# Patient Record
Sex: Female | Born: 1960 | Race: White | Hispanic: No | Marital: Married | Smoking: Former smoker
Health system: Southern US, Community
[De-identification: ages and names within clinical notes are randomized; demographics above are authoritative.]

## PROBLEM LIST (undated history)

## (undated) DIAGNOSIS — I10 Essential (primary) hypertension: Secondary | ICD-10-CM

## (undated) DIAGNOSIS — K5792 Diverticulitis of intestine, part unspecified, without perforation or abscess without bleeding: Secondary | ICD-10-CM

## (undated) DIAGNOSIS — K219 Gastro-esophageal reflux disease without esophagitis: Secondary | ICD-10-CM

## (undated) DIAGNOSIS — E785 Hyperlipidemia, unspecified: Secondary | ICD-10-CM

## (undated) HISTORY — DX: Hyperlipidemia, unspecified: E78.5

## (undated) HISTORY — DX: Essential (primary) hypertension: I10

## (undated) HISTORY — PX: ABDOMINAL HYSTERECTOMY: SHX81

## (undated) HISTORY — PX: OTHER SURGICAL HISTORY: SHX169

## (undated) HISTORY — DX: Gastro-esophageal reflux disease without esophagitis: K21.9

## (undated) HISTORY — DX: Diverticulitis of intestine, part unspecified, without perforation or abscess without bleeding: K57.92

---

## 1999-04-29 HISTORY — PX: NASAL SEPTUM SURGERY: SHX37

## 2000-02-25 ENCOUNTER — Emergency Department: Admit: 2000-02-25 | Payer: Self-pay | Source: Emergency Department | Admitting: Emergency Medicine

## 2003-01-29 ENCOUNTER — Inpatient Hospital Stay
Admission: EM | Admit: 2003-01-29 | Disposition: A | Discharge status: Home / Self Care | Payer: Self-pay | Source: Emergency Department | Admitting: Specialist

## 2003-06-29 ENCOUNTER — Inpatient Hospital Stay
Admission: EM | Admit: 2003-06-29 | Disposition: A | Discharge status: Home / Self Care | Payer: Self-pay | Source: Emergency Department | Admitting: Psychiatry

## 2005-11-20 ENCOUNTER — Ambulatory Visit: Payer: Self-pay | Admitting: Internal Medicine

## 2008-02-14 ENCOUNTER — Ambulatory Visit: Payer: Self-pay

## 2008-02-17 ENCOUNTER — Inpatient Hospital Stay: Payer: Self-pay

## 2012-04-30 NOTE — Discharge Summary (Unsigned)
ATTENDING MD:  Bland Span, MD      ADMITTED:      06/29/2003      DISCHARGED:    07/03/2003            HISTORY OF PRESENT ILLNESS:  This is the 1st [facility]      psychiatric admission for this 52 year old married female who was detained      to our psychiatric unit with the following history.  The patient has a      history of manic depressive illness, possibly bipolar type 2, as well as      alcoholism, who was detained after she went through a bout of drinking for      a few days prior to this hospitalization.  The patient told her husband      that she was going to take some pills.  She had been quite unhappy with her      life.  In fact, her husband has been quite resentful of her uncontrolled      addictive disorder with alcoholism.  The patient's husband currently is      going through chemotherapy for treatment of cancer (melanoma).  The patient      apparently started drinking again under tension at home and that created      another crisis, which resulted in the patient's detention.  For further      details, please review the patient's admission note.            PERSONAL HISTORY:  Please review the patient's admission note.            MENTAL STATUS EXAMINATION:  The patient initially was seen on the unit on      June 30, 2003.  The patient appeared to be somewhat depressed and quite      withdrawn.  Her affect was sad.  There was no active suicidal ideation or      thoughts.  There were no delusions or hallucinations.  Cognitive function      was good.  Insight and judgment were good.            HOSPITAL COURSE:  Initial diagnostic impression of dysthymic disorder,      bipolar disorder type 2.  The patient remained on our unit.  During the      course of the hospitalization, the patient was treated with Effexor as the      main antidepressant.  She was given Prevacid for gastritis.  She was      monitored for possible withdrawal symptoms.  The patient's vital signs were       monitored very closely.  The patient was able to sleep fairly well.  At the      final hearing, the patient's detention order was dismissed.  The patient      was discharged to her outpatient psychiatrist for outpatient follow up for      dual-diagnoses treatment upon dismissal of the detention order.            LABORATORY DATA:  While the patient was on the unit the following      laboratory tests were done and the results are as follows:  CBC was within      normal limits.  Comprehensive chemistries showed sodium study elevated at      144, otherwise, was within normal limits.  Drug toxicology screen was      negative.  Thyroid stimulating hormone (TSH)  was 1.75.  Serum beta hCG was      negative.  UA was within normal limits.            SIGNIFICANT FINDINGS:  Chest x-ray was normal.  EKG was normal.            FINAL DIAGNOSES      AXIS   I:           1.   Bipolaraffective disorder type 2.           2.   Dysthymic mood disorder.           3.   Alcoholism.      AXIS  II:  Deferred.      AXIS III:  Deferred.      AXIS  IV:  Severe.      AXIS   V:  Is 35/45.            CONDITION ON DISCHARGE:  Improved.            MEDICATIONS AT THE TIME OF DISCHARGE:  None.  (The patient was discharged      upon dismissal of detention order).                                          ___________________________________     Date Signed: _______________      Bland Span, MD                  D 09/20/2003 11:32 A; T 09/21/2003  6:56 P; [id number] - - , [id number], #[id number]      CC:  Bland Span, MD

## 2012-06-09 ENCOUNTER — Inpatient Hospital Stay: Payer: Exclusive Provider Organization | Attending: Psychiatry | Admitting: Mental Health

## 2012-06-09 VITALS — BP 123/67 | HR 65 | Resp 14 | Ht 66.0 in | Wt 137.0 lb

## 2012-06-09 DIAGNOSIS — F319 Bipolar disorder, unspecified: Secondary | ICD-10-CM | POA: Insufficient documentation
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
## 2012-06-09 NOTE — Progress Notes (Signed)
Called outpatient psychiatrist and left a VM to call back.

## 2012-06-09 NOTE — Progress Notes (Addendum)
Group 2:  Psychoeducation      Topic: Understanding Relapse  -Symptom Management and Relapse Awareness    Time:  10:10 am to 11:05 am     Attendance: 0 minutes/ Was attending her admissions assessment during this time.    Charlotte Holland

## 2012-06-09 NOTE — Progress Notes (Addendum)
06/11/2012    History of Present Illness:  Patient is a 52 y.o. female presents with manic symptoms.     Referred by [facility]   Source: Patient  Reliable? Yes    Chief Complaint: I just feel like I am not myself.    Note  Onset of symptoms was 1 month.     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 Patient reports that, approximately 4-6 weeks ago, she began experiencing increased manic symptoms, including increase irritability (gettig angry at music, friends, family), irresponsibility (unprotected sex, spending $500 all at once), distractibility, racing thoughts, accompanied by less severe changes in sleep (decreased), speech (pressured), and grandiosity. Precipitating factors include recent medication changes and family conflict. Patient reports the she was previously maintained on Lithium and Cymbalta for many years, but in December 2013 Lamictal was added "slowly" to this regimen, and then titrated to 100mg . The patient reports that was the only medication change, and denies recent drugs or alcohol usage. She states that, subsequently, she began experiencing manic symptoms listed above, and that friends and family also commented on the change in her mood. 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 She states that she ultimately presented voluntarily to [facility], where she was admitted to their psychiatric unit and hospitalized for approximately 3 weeks (she briefly discharged after 8 days, but soon presented again for further management). During her hospitalization, the Lamictal and Cymbalta were both discontinued, as it was felt that they may have contributed to her apparent manic state. Kasandra Knudsen was given a short trial, but d/c'd secondary to side effects (RLS). She was ultimately  discharged on 06/07/2012 with a diagnosis of Bipolar I, MRE mania, and prescribed Lithium 600mg  PO BID and Depakote 500mg  PO BID. Patient states that her Lithium and VPA levels were at effective blood levels, although labs are not available at this time.     The patient was interviewed with supervising psychiatrist, Dr. Baltazar Apo. She currently denies all thoughts of harm to self or others at this time and appears mildly positive for some residual manic symptoms, including distractibility, inappropriate mood reactivity (mild euphoria), and FOI. She reports no significant concerns with her current medication regimen, and feels they are helping with mood, sleep, and other manic symptoms. However, she endorses concern for how this event might have a negative impact on her job as a Nurse, learning disability (she remains on FML) and mild frustration at why she was "not back to baseline." She reports a several year history of Bipolar II ("I was told that I had hypomania), but reports that this is the most severe episode of mania she has experienced. She denies significant sleep disturbances in the past that would suggest full mania, but appears uncertain about whether she has indeed experienced discrete periods of mania with decreased need for sleep and/ or increased energy in the past. 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 Stressors include a "falling out with my mother and sister," (which she declines to go into further detail about  at this time, except to say that she has never been close to her mother) and some sadness at the thought that her youngest child will be leaving home to go to college soon.  She denies any h/o psychotic, delusional, AVH  symptoms.     Protective factors include education, support system, motivation for treatment, strong sense of self worth.     Suicidal ideation:no  Suicidal plan: no  Homicidal ideation: no      Psychosocial Stressors: family, health and occupational. Please refer to Axis IV under diagnosis section of this document for additional information.    Complaints of Pain: none    Functioning Relationships: good support system, gets along well with co-workers, good relationship with children and poor relationship with parents    Past Psychiatric History:     Previous Treatment/diagnosis: previous diagnosis of Bipolar II, Mood disorder NOS, anxiety do NOS, ETOH-dep  Current Psychiatrist: Dr. Evie Lacks, [phone], [phone] pt has signed ROI permission to coordinate care with this provider  Current Therapist: patient has referral for a new therapist at this time.   Previous hospitalizations: TDO's to [facility] in 2005, but dismissed by court. [facility] in Dec 2009. Per chart, she has been hospitalized for manic symptoms in the past, for a total of 4 times.   Previous suicide attempts:  At least two: 2005, when she combined prescription drugs and ETOH, but she states it was not a true attempt, as she did it in front of a friend. She reports feeling suicidal in 2009, but now reports that she always wanted to live.  Previous medications: Lithium, Cymbalta, Seroquel (caused RLS), Abilify (caused akathesia), Geodon (akathesia), Zyprexa, "all of the anti-depressants," Zoloft, Buspar, Xanax, Ativan, Klonopin, Paxil, Gabitril. Best response from Lithium and Cymbalta     Substance Abuse History:    Drugs : has tried marijuana, cocaine, mushrooms, mescaline, speed, but none since college and drug screens were negative at [facility].  Use of Alcohol: previous dependence, none since 2006. No rehab. Has a charge for public intoxication, no DUIs.    Medical History    History of any medical problems? Remote h/o restrictive  eating, but heme and chem screens were reported as normal at the outside hospital. Denies head trauma or seizures.      There are no active problems to display for this patient.      No past medical history on file.     No past surgical history on file.       (Not in a hospital admission)    Allergies not on file     No family history on file.     Social history:    The patient was born and raised in [place], grew up in a military family and moved around a lot. Parents still together, has 2 older sisters. Patient was in [employer] for 3 years in the 1980's, d/c'd honorable. Has been in [place] since 1999. Divorced in 2006, ex husband was also in [employer]. 3 children: youngest is 40 and applying for college right now. Others are 19, 21 and in college. 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   Raised by biological parents? Yes.   No guns at home  no    Legal History:    Legal Concerns as follows:  The patient has been involved with the police as a result of drunk in public charge years ago, no current charges.      Psychiatric Review Of Systems:  See HPI    PHYSICAL/SOMATIC Complaints  The patient lists: no physical complaints.      Objective:    Physical examination:  Patient appears moderately built, well nourished, no involuntary movements  Mucous Membranes moist  HEENT: Pupils equal, no nystagmus  Lungs: Clear to auscultation. No wheezing/rhonchi/rubs  CVS: S1 S2 normal, no murmurs/gallops/rubs  Abdomen: Soft non tender, BS present in all quadrants  Extremities: Normal  Neuro: Gait normal, cranial nerves grossly intact, no motor deficits      Psychiatric Specialty Examination   (1-5 bullets- Problem Focused; at least 6 bullets Expanded Problem Focused; at least 9 bullets - Detailed; all bullets- Comprehensive Exam)     [x] Vital Signs see RN assessment that I have reviewed.   General Appearance and Manner:      [x] age appropriate    [] bearded    [x] casually dressed       [] deviant     [x] cooperative  [] disheveled    [] older than stated age    [] overweight    [] piercings    [] tattooed    [] thin & gaunt looking    [] well dressed    [] younger than stated age     [x] good eye contact    [] avoidant eye contact    [] hesitant       Musculoskeletal: [x] normal    [] rigidity  [] flaccid       [] akathesia    [] choreaoathetoid movt [] tics    Gait: [x] normal gait    [] gait abnormality_______         Speech:  [x] Normal pitch     [x] normal volume    [] articulation error    [] delayed    [] increased latency of response     [] loud    [] pressured    [] profane     [] soft [] perseveration     Thought processes:  [x] Normal    []  goal directed   [] logical    []  illogical    [] flight of ideas     [] goal directed    [] concrete    [x]  associations intact [x]  abstract reasoning intact     Description of associations []  loose   [] circumstantial     [] concrete    [] tangential    [x]  intact      Description of abnormal or psychotic thoughts []  hallucination   []  delusions     safety:  [x]  absent of suicidal or homicidal ideation [] suicidal ideation      [] suicidal plan      [] suicidal intent      [] passive suicidal ideation      [] homicidal ideation      [] homicidal plan      [] homicidal intent []  actively trying to hurt self []  agitation []  preoccupation with violence     judgment  and insight [x]  intact    []  limited       []  fair    [] significantly lacking  []  description_______         Orientation [x] fully oriented      [] disoriented to    [] time []  Person [] Place     Memory : [x]  grossly intact    []  immediate recall deficit  []  recent memory deficit  [] delayed memory deficit   [] MMSE_______    [] MOCA________     Attention/Concentration: [x] normal    []  distractible      [] inattentive         Language: [x] age appropriate    []  naming okay   []  repetition  Fund of knowledge: [x] age appropriate    []  adequate   [] in adequate []  above average     Mood and affect: [x]  fine     [] angry    [] anxious    [] constricted    [] depressed      [] euphoric    [] euthymic    [] irritable    [] sad   Other Findings          Assessment:    Axis I:   Bipolar DO NOS, MRE mania, r/o SIMD, h/o ETOH-dep (in remission)  Axis II:  Deferred  Axis III:    No past medical history on file.   No past surgical history on file.     Axis IV: Occupational problems  Other psychosocial or environmental problems  Problems related to social environment  Problems with access to health care services  Problems with primary support group  Axis V:  GAF = 45    Safety Assessment: The patient denies any self injurious thoughts or thoughts of harm to self or others.    Plan:    Safety: The patient denies any self injurious thoughts or thoughts of harm to self or others. Will assess safety daily in program    Admit to [facility] for stabilization of mood. Encourage participation in groups.    Will coordinate care with outpatient psychiatrist. Call placed to Dr. Evie Lacks on 06/10/2012    Counseling referral patient has resources and will set this appointment herself  Medications:  Continue Lithium and Depakote    Current Medications and doses:   Current Outpatient Prescriptions   Medication Sig Dispense Refill   . divalproex EC/DR (DEPAKOTE EC/DR) 500 MG EC tablet Take 500 mg by mouth 2 (two) times daily.       Marland Kitchen lithium 600 MG capsule Take 600 mg by mouth 2 (two) times daily.           Expected LOS: 1 week  Treatment options and alternatives reviewed with patient and they concur with the above plan.    Record Review: moderate.    Attending note:    PLEASE SEE DICTATED NOTE, I SPOKE WITH PATIENT'S PSYCHIATRIST    Navneet K Baltazar Apo

## 2012-06-09 NOTE — Progress Notes (Addendum)
Group 5:  Wrap-Up and self Care  Topic:  Home Care Planning / Enhancing Self-Care Skills    Time:  2:00PM to 2:55 PM      Attendance: 55 minutes    Participation:  Active    Intervention:  Skill application/rehearsal and Cognitive/behavioral restructuring    Goal:   To review learned skills and plan to implement skills for daily use, promote increased mindfulness, encourage positive behavioral changes,    Summary and understanding of how patient will apply skills and knowledge gained today:      See scanned Daily Home-Care planning note (dated 06/09/2012) for specifics on what the pt reported. Pt was also introduced to tips for self-care, where she identified she plans to rest, eat rest, and sleep as strategies that she will do today for self-care.     Homicidal/Suicidal Ideation: Absent;    Behavioral Observations:  Presenting Mental Status  Orientation Level: Oriented X4  Memory: Recent memory impaired (Left without a coat today)  Thought Content: normal  Thought Process: normal  Behavior: normal  Consciousness: Alert  Impulse Control: normal  Perception: normal  Eye Contact: normal  Attitude: cooperative  Mood: normal ("neutral, ok")  Hopelessness Affects Goals: No  Hopelessness About Future: No  Affect: normal  Speech: normal  Concentration: impaired  Insight: good  Judgment: good  Appearance: normal  Appetite: normal  Weight change?: normal  Energy: decreased (Normally has high energy)  Sleep: difficulty staying asleep  Reliability of Reporter/Patient: good     Risk Assessment:  Suicide Assessment Tool (SAT)   Thoughts (Suicidal Ideation): verbalizes no current ideation  Plans (Suicidal Ideation): none to occasional thoughts of suicide with no plan  Method (Suicidal Ideation): none  Impulse Control (Behavior Cues): inconsistent impulse control  Behavioral Activity (Behavior Cues): some changes in usual behavior patterns  Preparation for Death (Behavior Cues): none  Predominant Mood or Affect: signs of moderate  depression or indicators of helplessness  Mood Stability (Mood/Affect): labile mood, moderate anxiety  Tolerance of Feelings (Mood/Affect): increased affective distress  Problem Solving (Cognition/Perception): limited problem solving  Perception (Cognition/Perception): accurate perception of reality  Monitoring/Suicide Alert Level: Routine monitoring     Daily Level of Care Assessment:    Remain at current level of care, with following plan:   Utilize safety/home care plan as needed, Patient has copy of plan, Identify signs/symptoms of illness, Develop/practice coping and relapse prevention skills, Identify/develop support system    Comments: Patient appears to remain engaged in the therapeutic process and will need continued supports for skill application.      Johnnette Gourd Witcher

## 2012-06-10 ENCOUNTER — Inpatient Hospital Stay: Payer: Exclusive Provider Organization

## 2012-06-11 ENCOUNTER — Inpatient Hospital Stay (HOSPITAL_PSYCHIATRIC): Payer: Exclusive Provider Organization | Admitting: Mental Health

## 2012-06-11 NOTE — Progress Notes (Signed)
Group 1:  Daily Planning  Time:  10:15 am to 11:00 am       Attendance: 45 minutes    Participation:  Active/ PHP program started on a delay due to severe winter weather these past couple of days    Intervention:  Skill application/rehearsal and Cognitive/behavioral restructuring    Goal:  To set therapeutic goals for the day, review progress towards treatment goals, identify and process through barriers to achieve goals    Content of Session: Pt reported to be feeling: "angry, sad, and frustrated".  See scanned Daily group Note dated 06/11/2012 for specifics on pt's mood, symptoms, progress towards treatment goals, and Aftercare planning.     Homicidal/Suicidal Ideation: Absent;      Behavioral Observations:  Presenting Mental Status  Orientation Level: Oriented X4  Memory: No Impairment  Thought Content: normal  Thought Process: normal  Behavior: normal  Consciousness: Alert  Impulse Control: normal  Perception: normal  Eye Contact: normal  Attitude: cooperative  Mood: depressed;irritable;anxious  Hopelessness Affects Goals: Yes  Hopelessness About Future: No  Affect: labile  Speech: normal  Concentration: impaired  Insight: good  Judgment: good  Appearance: normal  Appetite: decreased  Weight change?: normal  Energy: decreased  Sleep: difficulty staying asleep  Reliability of Reporter/Patient: good     Risk Assessment: Patient endorsed thoughts of death, but denied any intent or plan. She reports protective factors of being hopeful and her 3 children.     Charlotte Holland

## 2012-06-11 NOTE — Progress Notes (Signed)
Subjective:    The patient reports the following:  Met with client alone initially and later with Dr. Baltazar Apo. The client reports getting agitated easliy and irritable. She denies feeling depressed or manic at this time. She denies feeling suicidal. She is adjusting to recent changes in medication and coming to terms with recent manic behaviors.     Medications:  Current Outpatient Prescriptions   Medication Sig Dispense Refill   . hydrOXYzine (VISTARIL) 25 MG capsule Take 25 mg by mouth 3 (three) times daily as needed.       . divalproex EC/DR (DEPAKOTE EC/DR) 500 MG EC tablet Take 500 mg by mouth 2 (two) times daily.       Marland Kitchen lithium 600 MG capsule Take 600 mg by mouth 2 (two) times daily.           VS: LMP 04/29/2007    Mental Status Evaluation:  Appearance:  casually dressed   Behavior:  normal   Speech:  normal volume   Mood:  anxious   Affect:  mood-congruent, full range   Thought Process:  goal directed   Thought Content:  denies si, hi, ah, vh   Sensorium:  person, place and time/date   Cognition:  grossly intact   Insight:  fair   Judgment:  fair     Assessment/Plan:  Axis I:   Bipolar 1 disorder, manic  296.40  Axis II:  Defer  Axis III:  See problem list in the medical record  Axis IV: Other psychosocial or environmental problems  Problems related to social environment  Axis V:  Current:  41-50 serious symptoms               Highest in Past year:  51-60 moderate symptoms      Medication management:    Spent 20 minutes discussing recent events that lead to hospitalization and PHP. Discussed current medication regime and importance of taking medication as prescribed.   Dr. Baltazar Apo discussed medication options to treat agitiation. Prescription of Vistaril 25mg  po tid prn was provided by Clinical research associate. Risks and benefits discussed.    Treatment options and alternatives reviewed with patient and they concur with the above plan.

## 2012-06-11 NOTE — Progress Notes (Signed)
Group 3 : Cognitive Therapy     Topic: Stress Management     Time: 12:35 PM to 1:20 PM   Attendance: 40 minutes   Participation: Active   Intervention: Chartered certified accountant and Learning Activities Skills Worksheet     Goal: Reframe negative thought patterns,  Identification of behavioral and physical symptoms related to stress, Positive behavioral changes, stress management coping skills    Response to intervention/skills and/or knowledge gained:  Patient's participation in group was active. She gained insight into ways to re-framing negative thought patterns that promote automatic negative thoughts, limit problem solving, and fuel irrational thinking patterns. She identified both behavioral symptoms and physical symptoms that are activated by her stress. Patient stated that when she is stressed it difficult for her to concentrate and she experiences stomach aches.  By using CBT based skills and techniques, the patient was able to gain perspective about her response to stress and see how she can positively influence her feelings and behaviors. Overall, she noted to have benefited from the session and learned skills and concepts to aide her treatment goal to mitigate her anxiety and depression.     .    Behavioral Observations: Patient was cooperative, engaged, and pleasant. She presented with a calm mood and mood-congruent affect.    Comments:  The patient was able to share with the group about a very personal experience with her family. She shared that the she and her sister experienced an argument on New Years Eve that led to an exchange that had disrupted their relationship. She was able to ask the group for help in how to go about opening up lines of communication.  She seemed to process the suggestion that she take care of her mental health first and approach the family when she was feeling stronger. Patient stated she enjoyed the deep breathing exercise.     Despina Pole

## 2012-06-11 NOTE — Progress Notes (Signed)
Group 2: Psychoeducation Topic:  Self Portaits   Time:  11:10 am to 12:00 pm    Attendance: 50 minutes    Participation:  Active    Intervention:  Skill application/rehearsal    Goal:  To increase understanding of self and of mindfulness and to practice giving and receiving compliments  Response to intervention/skills and/or knowledge gained: Each patient was given a paperbag to decorate with a self portrait.  Patients were then instructed to fill the bag with words or images that expressed who they are or wanted to become.  Patients were also encouraged to write a compliment to each group member and put it in their bag.  Each person then shared the contents of the bag with the group and read aloud their compliments.      Behavioral Observations: Patient was Cooperative, Engaged and Pleasant; she presented with depressed mood and constricted affect.    Comments: This is what patient shared with group about her self portrait:  I didn't want to draw because it wouldn't be good.  I struggled with alcohol.  8 years sober.  I have 3 awesome kids. I took my kids to [place] when I was manic, whoa, it was worth it, but 20 000 dollars later.

## 2012-06-11 NOTE — Progress Notes (Signed)
Group 4: Interpersonal Relationships     Topic: Getting personal needs met     Time: 1:00 PM to 1:50 PM     Attendance: 50 minutes   Participation: Passive  Intervention: Experiential: Skill application/rehearsal and collaborative art project     Goal: Increased mindfulness, Positive behavioral changes, To improve communication skills; Draw parallels of how people seek and ask to get their emotional needs met     Response to intervention/skills and/or knowledge gained: Patient's participation in group was passive. Patient was not able to participate appropriately in the group exercise. She was asked to create the image of her safe place by soliciting the help of others and collaboratively recreate the image onto paper. The exercise is designed to highlight the parallels between asking others for help and balancing the patient's own needs. Patient stated that she did not want to participate in activity. Group discussion also highlighted what the benefit of being less rigid with expectations, and open for flexibility can bring.     Behavioral Observations: Patient was uncooperative and Pleasant; she presented with depressed mood and mood-congruent affect.     Comments:  Patient did not participate in the written activity. Patient was asked by another patient to assist in creating an image on her paper. Upon this request patient left the room. Counselor spoke with patient regarding her departure.  Patient stated, "I can't do this. I feel like something is wrong with me because I can't do this and everybody is having such and easy time participating." Counselor asked patient if she would return to the room and patient agreed. Patient returned to the room but did not participate.     [name]

## 2012-06-11 NOTE — Progress Notes (Signed)
Group 4: Interpersonal Relationships   Topic: Effective Communication  Time: 1:35 PM to 2:20 PM     Attendance: 45 minutes   Participation: Active  Intervention: Copy    Goal: Increased mindfulness, Positive behavioral changes, To improve communication skills    Response to intervention/skills and/or knowledge gained:  Patient's participation in group was active. She gained insight into ways to improve communication skills, enhance the patient's self-esteem, identify and express her values, and improve overall assertiveness skills. She was able to process that good communication is the key to success.  Group discussions focused on important steps for acquiring good communication skills such as listening, body language, and tone of voice. She was able to process that good communication is a key to success. By working on these skills, the patient reported feeling more empowered to address their over all goal of improving and sustaining gains made towards full mental health recovery. Overall, she noted to have benefited from the session and learned skills and concepts to aide her treatment goal to mitigate her anxiety and depression.    Behavioral Observations: Patient was cooperative, engaged, and pleasant. She presented with a calm mood and mood-congruent affect.    Comments: Charlotte Holland shared her communication difficulties with her family and asked the group for some feedback on how she should proceed to resolve the communication breakdown.  She actively listened to the group members and was able to reflect back to the group the different suggestions that were made.      Charlotte Holland

## 2012-06-11 NOTE — Progress Notes (Signed)
Group 5:  Wrap-Up and self Care  Topic:  Home Care Planning / Enhancing Self-Care Skills    Time:  2:25PM to 3:15 PM      Attendance: 55 minutes    Participation:  Active    Intervention:  Skill application/rehearsal and Cognitive/behavioral restructuring    Summary and understanding of how patient will apply skills and knowledge gained today:      See scanned Daily Home-Care planning note (dated 06/11/2012) for specifics on what the pt reported. Pt was also introduced to tips for self-care, where she identified practicing self-soothing techniques as strategies that she will do today for self-care.     Homicidal/Suicidal Ideation: Absent;    Behavioral Observations:  Presenting Mental Status  Orientation Level: Oriented X4  Memory: No Impairment  Thought Content: normal  Thought Process: normal  Behavior: normal  Consciousness: Alert  Impulse Control: normal  Perception: normal  Eye Contact: normal  Attitude: cooperative  Mood: depressed;irritable;anxious  Hopelessness Affects Goals: Yes  Hopelessness About Future: No  Affect: labile  Speech: normal  Concentration: impaired  Insight: good  Judgment: good  Appearance: normal  Appetite: decreased  Weight change?: normal  Energy: decreased  Sleep: difficulty staying asleep  Reliability of Reporter/Patient: good     Risk Assessment:  Suicide Assessment Tool (SAT)   Thoughts (Suicidal Ideation): verbalizes no current ideation  Plans (Suicidal Ideation): none to occasional thoughts of suicide with no plan  Method (Suicidal Ideation): none  Impulse Control (Behavior Cues): inconsistent impulse control  Behavioral Activity (Behavior Cues): some changes in usual behavior patterns  Preparation for Death (Behavior Cues): none  Predominant Mood or Affect: signs of moderate depression or indicators of helplessness  Mood Stability (Mood/Affect): some mood fluctuations, some anxiety/agitation  Tolerance of Feelings (Mood/Affect): feelings periodically distressing  Problem Solving  (Cognition/Perception): limited problem solving  Perception (Cognition/Perception): accurate perception of reality     Daily Level of Care Assessment:    Remain at current level of care, with following plan:   Utilize safety/home care plan as needed, Patient has copy of plan, Develop/practice coping and relapse prevention skills    Comments: Patient appears to remain engaged in the therapeutic process and will need continued supports for skill application.      Johnsie Kindred

## 2012-06-14 ENCOUNTER — Inpatient Hospital Stay: Payer: Exclusive Provider Organization | Admitting: Mental Health

## 2012-06-14 NOTE — Progress Notes (Signed)
Group 1:  Daily Planning  Time:  9:00 am to 10:00 am       Attendance: 60 minutes    Participation:  active    Intervention:  Skill application/rehearsal and Cognitive/behavioral restructuring    Goal:  To set therapeutic goals for the day, review progress towards treatment goals, identify and process through barriers to achieve goals    Content of Session: Pt reported to be feeling: "hopeless, angry, anxious".  See scanned Daily group Note dated 06/14/2012 for specifics on pt's mood, symptoms, progress towards treatment goals, and Aftercare planning.     Homicidal/Suicidal Ideation: Mild; pt reported having suicidal ideations over the weekend. She described feeling very frustrated that she is not making progress as fast as she would want to. Pt noted that she does not have suicidal intent or plan, and "does not want to kill herself," but was saying so out of frustration. Pt contracted for safety and agreed to follow her safety plan when dealing with her anxiety: contact her friends, take her medications and use coping skills.       Behavioral Observations:  Presenting Mental Status  Orientation Level: Oriented X4  Memory: Recent memory impaired  Thought Content: normal  Thought Process: normal  Behavior: normal  Consciousness: Alert  Impulse Control: normal  Perception: normal  Eye Contact: normal  Attitude: cooperative  Mood: depressed;irritable  Hopelessness Affects Goals: No  Hopelessness About Future: No  Affect: labile  Speech: normal  Concentration: impaired  Insight: good  Judgment: good  Appearance: normal  Appetite: decreased  Weight change?: normal  Energy: decreased  Sleep: difficulty staying asleep  Reliability of Reporter/Patient: good     Risk Assessment:        Charlotte Holland

## 2012-06-14 NOTE — Progress Notes (Addendum)
Group 2:  Psychoeducation      Topic: Estate manager/land agent and Growth     Time:  10:10 am to 11:05 am       Attendance: 55 minutes    Participation:Active    Intervention: Chartered certified accountant and Cognitive/behavioral restructuring, Worksheet-Baggage Check    Goal: Identification of cognitive distortions, Positive behavioral changes,  To help clients conceptualize and identify their "baggage"  (unresolved issues or old messages) that is weighing them down in their daily lives.    Response to intervention/skills and/or knowledge gained:  Patient's participation in group was appropriate. She was receptive to the topic introduced on Self Discovery and Growth.Patient participated in discussion and dialogued about her "old emotional baggge" that was weighing her down. Throughout the session, patient demonstrated the ability to identify, and conceptualize her unresolved issues "baggage" from the past that was weighing her down, and was successful in her bility to practice using cognitive behavioral restructuring to identify cognitive distortions, and promote positive behavioral changes. Overall she noted to have benefited from the session and learned skills and concepts to aide her treatment goal to mitigate her depression.      Behavioral Observations: Patient was cooperative, expresses self well, good eye contact, oriented x3 and responsive to questions; she presented with appropriate to circumstances mood and mood-congruent affect      Comments: Patient gave personal examples of unresolved "baggage" that consisted of past trauma, alcoholism behaviors, and her estranged relationship with her mother. Patient was able to process her thoughts, and feelings and received positive feedback from her peers.     Johnnette Gourd Witcher

## 2012-06-14 NOTE — Progress Notes (Signed)
Group 5:  Wrap-Up and self Care  Topic:  Home Care Planning / Enhancing Self-Care Skills    Time:  2:00PM to 2:50 PM      Attendance: 50 minutes    Participation:  Active    Intervention:  Skill application/rehearsal and Cognitive/behavioral restructuring    Goal:   To review learned skills and plan to implement skills for daily use, promote increased mindfulness, encourage positive behavioral changes,    Summary and understanding of how patient will apply skills and knowledge gained today:      See scanned Daily Home-Care planning note (dated 06/14/2012) for specifics on what the pt reported. Pt was also introduced to tips for self-care, where she identified dinner with daughter, and sleep as strategies that she will do today for self-care.     Homicidal/Suicidal Ideation: Absent;    Behavioral Observations:  Presenting Mental Status  Orientation Level: Oriented X4  Memory: Recent memory impaired  Thought Content: normal  Thought Process: normal  Behavior: normal  Consciousness: Alert  Impulse Control: normal  Perception: normal  Eye Contact: normal  Attitude: cooperative  Mood: depressed;irritable  Hopelessness Affects Goals: No  Hopelessness About Future: No  Affect: labile  Speech: normal  Concentration: impaired  Insight: good  Judgment: good  Appearance: normal  Appetite: decreased  Weight change?: normal  Energy: decreased  Sleep: difficulty staying asleep  Reliability of Reporter/Patient: good     Risk Assessment:  Suicide Assessment Tool (SAT)   Thoughts (Suicidal Ideation): verbalizes no current ideation  Plans (Suicidal Ideation): none to occasional thoughts of suicide with no plan  Method (Suicidal Ideation): none  Impulse Control (Behavior Cues): adequate impulse control  Behavioral Activity (Behavior Cues): consistent in behavior or patterns  Preparation for Death (Behavior Cues): none  Predominant Mood or Affect: signs of mild depression or indicators of hopefulness  Mood Stability (Mood/Affect): some  mood fluctuations, some anxiety/agitation  Tolerance of Feelings (Mood/Affect): feelings periodically distressing  Problem Solving (Cognition/Perception): limited problem solving  Perception (Cognition/Perception): accurate perception of reality     Daily Level of Care Assessment:    Remain at current level of care, with following plan:   Utilize safety/home care plan as needed    Comments: Patient appears to remain engaged in the therapeutic process and will need continued supports for skill application.      Johnnette Gourd Witcher

## 2012-06-14 NOTE — Progress Notes (Signed)
Group 3 :  Cognitive Behavioral Therapy    Topic: Understanding Cognitive Distortions and Learning how to challenge them      Time:  11:15 AM to 12:15 PM       Attendance: 60 minutes    Participation: active, attentive    Intervention:  Interactive didactic instruction, Skill application/rehearsal and Cognitive/behavioral restructuring    Goal:  Identification of cognitive distortions, Increased mindfulness, Positive behavioral changes, To increase understanding of different types of cognitive distortions    Response to intervention/skills and/or knowledge gained:  Patient's participation in group was appropriate. Charlotte Holland was introduced to the concept of cognitive distortions, and their role in fostering, unhealthy feelings and behaviors that lead to depression, anxiety, and anger. Pt was able to gain insight into what cognitive distortions she was more susceptible to: jumping to conclusions. Pt explored how her cognitive distortions are triggered, the negative behavior cycles that result, and how she perpetuates those distortions. Charlotte Holland was able to see the maladaptive nature of her cognitive distortion and explored ways she could replace those cognitive distortions with more adaptive, positive/balanced ways of thinking. Pt noted that she benefitted from the session by receiving support, empathy, and validation from her peers and counselor.  Pt was also introduced to the concept of using a cognitive cue card as a way to help her re-frame the negative thought. Today's Cognitive Cue card took the form of: "Just because ----, doesn't mean-----; in fact,-----." Pt was able to use the structure to come up with her own cue card.     Overall she noted to have benefitted from the session and learned skills and concepts to mitigate her depression, and mood swings.    Behavioral Observations: Patient was Cooperative, Chief Operating Officer; she presented with relaxed and WNL mood and mood-congruent  affect.      Comments:    [name]

## 2012-06-14 NOTE — Progress Notes (Addendum)
Group 4: Interpersonal Relationships   Topic: Anger   Time: 1:10 PM to 1:55 PM   Attendance: 45 minutes   Participation: Active   Intervention: Interactive didactic instruction     Goal: To increase understanding of the signs/triggers of anger and how it affects relationships     Response to intervention/skills and/or knowledge gained: Patient's participation in group was appropriate. The cycle of events, negative thoughts, feelings, bodily symptoms, and behaviors associated with anger was discussed, with emphasis on identifying the bodily symptoms that signal that we are angry. Different types of behaviors that can result (e.g., yelling, throwing things vs. Avoidance, cutting people off, isolation) were identified and discussed. Group members took turns answering questions about anger and how it effects their relationships with others and completed a worksheet about how various life domains can be negatively (or positively) affected by anger. More positive coping strategies to improve communication were discussed. She gained insight into ways to improve communication skills, enhance the patient's self-esteem, identify and express her values, and improve overall assertiveness skills. By working on these skills, the patient reported feeling more empowered to address their over all goal of improving and sustaining gains made towards full mental health recovery. Overall she noted to have benefitted from the session and learned skills and concepts to help her express her anger in more positive ways.     Behavioral Observations: Patient was alert; she presented with WNL mood and normal affect.     Comments: Patient was able to identify many of the signs/symptoms of anger.  She provided positive feedback to group members about their responses and indicated that anger is something that she struggles with, but did not share her own experiences, even when encouraged to by group leader.  She wrote down several meaningful  quotes about anger and said she was particularly impressed by group members who had been able to repair relationships damaged in the past by anger.    Orson Eva    I agree with the clinical interventions provided by doctoral extern Orson Eva.    Johnsie Kindred

## 2012-06-15 ENCOUNTER — Inpatient Hospital Stay: Payer: Exclusive Provider Organization | Admitting: Mental Health

## 2012-06-15 NOTE — Progress Notes (Signed)
Group 1:  Daily Planning  Time:  9:05 am to 10:05 am       Attendance: 60 minutes    Participation:  Active    Intervention:  Skill application/rehearsal and Cognitive/behavioral restructuring    Goal:  To set therapeutic goals for the day, review progress towards treatment goals, identify and process through barriers to achieve goals    Content of Session: Pt reported to be feeling: "Anxious"   See scanned Daily group Note dated 06/15/2012 for specifics on pt's mood, symptoms, progress towards treatment goals, and Aftercare planning.     Homicidal/Suicidal Ideation: Absent;      Behavioral Observations:  Presenting Mental Status  Orientation Level: Oriented X4  Memory: No Impairment  Thought Content: normal  Thought Process: normal  Behavior: normal  Consciousness: Alert  Impulse Control: normal  Perception: normal  Eye Contact: normal  Attitude: cooperative  Mood: depressed  Hopelessness Affects Goals: No  Hopelessness About Future: No  Affect: normal  Speech: normal  Concentration: normal  Insight: good  Judgment: good  Appearance: normal  Appetite: normal  Reliability of Reporter/Patient: good     Risk Assessment:        Charlotte Holland

## 2012-06-15 NOTE — Progress Notes (Addendum)
Group 4:  Interpersonal Relationships    Topic: Building social skills through peer interactions    Time:  1:15 PM to 2:15 PM       Attendance: 50 minutes    Participation:  Mixed    Intervention:  Skill application/rehearsal and Celebrity Interview    Goal:  Increased mindfulness, To improve communication skills, practice social skills, and build group cohesion.     Response to intervention/skills and/or knowledge gained:  Patient's participation in group was appropriate. Charlotte Holland participated well in the activity and group processing. The task was designed to help build and reinforce self esteem by interacting with others and learn about each other by asking innocuous but personally revealing questions.  Cognitive distortions that make the Pt more susceptible to discount the positives, and discredit their positive attributes were processed and Pt gained insight into not allowing their negativity take away from the positive things she has to offer.  As part of the exercise, she was able to introduce her peer to the rest of the group and by doing so, practiced social skills, personal creativity, and assertiveness skills.     Initially pt was seen to be enjoying the activity, getting to know her partner on a one and one, but as time approached for her to present her partner in class, she became more anxious. After hearing positive things about herself, she became tearful and had to leave the group briefly.     Behavioral Observations: Patient was Cooperative and Reduced eye contact; he presented with anxious mood and mood-congruent affect.    Comments: Despite some initial hesitation, she was able to complete the task and overcome her anxieties.    [name]

## 2012-06-15 NOTE — Progress Notes (Signed)
Group 3 : Cognitive Behavioral Therapy     Topic: Introducing mindfulness     Time: 11:10 AM to 12:10 PM     Attendance: 60 minutes   Participation: Active   Intervention: Interactive didactic instruction     Goal: Increased mindfulness, Positive behavioral changes, To increase understanding of Dialectical Behavioral Therapy     Response to intervention/skills and/or knowledge gained: Patient's participation in group was appropriate. she gained insight into ways to re-framing negative thought patterns that promote automatic negative thoughts, limit problem solving, and fuel irrational thinking patterns. The basics and rationale of dialetical behavior therapy were introduced, with a focus on mindfulness. The term Dialectics was introduced and described, with an emphasis on finding the balance between two ironies and paradoxes. The patient was able to identify constructs that seems in opposition in her own life and was able to frame her thoughts about the harmony she would experience if those were in better balance in her life. The purpose of mindfulness and the benefits that come with focusing on the present, rather than the past or future, were discussed. Somatic and mental techniques that can be used to promote mindfulness were introduced. By using these skills and techniques, the patient was able to gain perspective on her thoughts, and see how she can positively influence her feelings and behaviors. Overall she noted to have benefited from the session and learned skills and concepts to mitigate her stress in balancing her work and relationship life. Pt noted that due to the cyclical nature of her bipolar disorder, she feels like she cannot have both: companionship and success at doing a job "that I love."    Behavioral Observations: Patient was Cooperative, Chief Operating Officer; she presented with anxious, calm and WNL mood and mood-congruent affect.    Comments: Patient participated actively in discussion,  asking questions to make sure that she understood how mindfulness can promote better emotional regulation if less focus is placed on past wrong/hurts that then spill over into the present. she was able to generate several ideas for working on staying in the present and indicated that she would like to work on staying in the here and now.    [name]

## 2012-06-15 NOTE — Progress Notes (Signed)
Group 5:  Wrap-Up and self Care  Topic:  Home Care Planning / Enhancing Self-Care Skills    Time:  2:15PM to 3:22 PM      Attendance: 67 minutes    Participation:  Active     Intervention:  Skill application/rehearsal and Cognitive/behavioral restructuring    Goal:   To review learned skills and plan to implement skills for daily use, promote increased mindfulness, encourage positive behavioral changes,    Summary and understanding of how patient will apply skills and knowledge gained today:      See scanned Daily Home-Care planning note (dated 06/15/2012) for specifics on what the pt reported. Pt was also introduced to tips for self-care, where she identified being mindful as strategies that she will do today for self-care.     Homicidal/Suicidal Ideation: Absent;    Behavioral Observations:  Presenting Mental Status  Orientation Level: Oriented X4  Memory: No Impairment  Thought Content: normal  Thought Process: normal  Behavior: normal  Consciousness: Alert  Impulse Control: normal  Perception: normal  Eye Contact: normal  Attitude: cooperative  Mood: depressed  Hopelessness Affects Goals: No  Hopelessness About Future: No  Affect: normal  Speech: normal  Concentration: normal  Insight: good  Judgment: good  Appearance: normal  Appetite: normal  Reliability of Reporter/Patient: good     Risk Assessment:  Suicide Assessment Tool (SAT)   Thoughts (Suicidal Ideation): verbalizes no current ideation  Plans (Suicidal Ideation): none to occasional thoughts of suicide with no plan  Method (Suicidal Ideation): none  Impulse Control (Behavior Cues): adequate impulse control  Behavioral Activity (Behavior Cues): consistent in behavior or patterns  Preparation for Death (Behavior Cues): none  Predominant Mood or Affect: signs of mild depression or indicators of hopefulness  Mood Stability (Mood/Affect): some mood fluctuations, some anxiety/agitation  Tolerance of Feelings (Mood/Affect): feelings periodically  distressing  Problem Solving (Cognition/Perception): limited problem solving  Perception (Cognition/Perception): accurate perception of reality     Daily Level of Care Assessment:    Remain at current level of care, with following plan:   Utilize safety/home care plan as needed, Patient has copy of plan, Identify signs/symptoms of illness, Develop/practice coping and relapse prevention skills, Identify/develop support system    Comments: Patient appears to remain engaged in the therapeutic process and will need continued supports for skill application.      Charlotte Holland

## 2012-06-15 NOTE — Progress Notes (Signed)
12:45 - 1:05  Writer met with patient to give her her treatment plan.   Patient stated she wanted to discuss her concerns about her progress.    Writer asked patient to review the progress she has made.      "I just want to be better all at once.  I don't want to say "oh great, I made dinner last night, but it is the first time I've made dinner in a month."    Patient also reports improved mood and less reactivity to stress.  Patient is noted by writer to appear more relaxed and less guarded, compared to conversation last week.  Patient reports, "I made a promise to the whole group that I'd go to the gym.  And I need to.  I want to."     Patient reported that she was feeling much better but is concerned about the return of the desire to contact men with whom she'd had casual, unprotected sex.  "This just isn't me."      Writer discussed with patient the benefit of being alert to triggers to impulsive actions.  Patient stated, "I know this is a symptom of my illness.  But I just don't understand, why would I go back to that?"      Writer and patient discussed the conflict between short term "rewards" and long term gains/goals.  Patient stated that the difficulty made sense to her in that regard, and that this is a symptom that needs to be managed.  Patient reported less anxiety about the symptoms.    Patient had not filled out a treatment planning sheet on day of admission, and so Clinical research associate provided one.  Patient said she would complete  treatment planning sheet tonight.

## 2012-06-15 NOTE — Progress Notes (Addendum)
Group 2:  Psychoeducation      Topic: Wellness and Recovery     Time:  10:15 am to 11:05 am       Attendance: 50 minutes    Participation:Active    Intervention: Chartered certified accountant and Skill application/rehearsal    Goal:  Increased mindfulness, Positive behavioral changes, To review learned skills and plan to implement skills for daily use, Identify and give example of a functional recovery goal    Response to intervention/skills and/or knowledge gained:  Patient's participation in group was appropriate. She gained insight into various concepts of mental health to promote positive behavior patterns, sustained medication compliance, better understanding of how the patient's illness came to be, and potential treatment options. Overall she noted to have benefited from the session and learned skills and concepts to aide her treatment goal to mitigate her depression.  The objectives of the group was to identify and give examples of a functional recovery goal and identify one or more skills or abilities they would like to have in the future.  Patient stated that she would like to begin working out. Patient stated that she was afraid to go back to the gym because she thought people would know that she was "crazy".  Patient stated that once she goes to the gym and gets started she will be able to be consistent; it is the initial step that is difficult. Patient stated that she was going to set a goal to go to the gym at least one time this week.      Behavioral Observations: Patient was alert, cooperative, expresses self well and good eye contact; she presented with normal mood and mood congruent affect.    Comments: Patient was pleasant and oriented x3. Patient actively participated in group, was receptive to feedback, and gave constructive feedback to other group members.  Patient stated that she is in a hurry to get back to normal. Patient stated, "normal is hanging out with friends and getting back to  work".  Patient stated, "I am looking forward to the future with a stable mood and little ups and downs".      Charlotte Holland

## 2012-06-16 ENCOUNTER — Inpatient Hospital Stay (HOSPITAL_PSYCHIATRIC): Payer: Exclusive Provider Organization | Admitting: Mental Health

## 2012-06-16 NOTE — Progress Notes (Addendum)
Group 1:  Daily Planning  Time: 9:10 am to 10:00 am       Attendance: 50 minutes    Participation:  Active    Intervention:  Skill application/rehearsal and Cognitive/behavioral restructuring    Goal:  To set therapeutic goals for the day, review progress towards treatment goals, identify and process through barriers to achieve goals    Content of Session: Pt reported to be feeling: "Angry, agitated and Anxiuos ".  See scanned Daily group Note dated 06/16/2012 for specifics on pt's mood, symptoms, progress towards treatment goals, and Aftercare planning.     Homicidal/Suicidal Ideation: Absent;      Behavioral Observations:  Presenting Mental Status  Orientation Level: Oriented X4  Memory: No Impairment  Thought Content: normal  Thought Process: normal  Behavior: tearful  Consciousness: Alert  Impulse Control: normal  Perception: normal  Eye Contact: normal  Attitude: cooperative  Mood: depressed;anxious  Hopelessness Affects Goals: No  Hopelessness About Future: No  Affect: angry  Speech: normal  Concentration: normal  Insight: good  Judgment: good  Appearance: normal  Appetite: normal  Weight change?: normal  Energy: decreased  Sleep: normal  Reliability of Reporter/Patient: good     Risk Assessment:        Dale Durham

## 2012-06-16 NOTE — Progress Notes (Addendum)
Group 2:  Psychoeducation      Topic: Self-Esteem - Recognizing the Positives in You     Time:  10:10 am to 11:00 am       Attendance: 50 minutes    Participation:Active    Intervention: Chartered certified accountant, Skill application/rehearsal and Cognitive/behavioral restructuring    Goal: Identification of cognitive distortions, Increased mindfulness, Positive behavioral changes    Response to intervention/skills and/or knowledge gained:  Patient's participation in group was appropriate. She gained insight into various concepts of mental health to promote positive behavior patterns, sustained medication compliance, better understanding of how the patient's illness came to be, and potential treatment options. Overall she noted to have benefited from the session and learned skills and concepts to aide her treatment goal to mitigate her depression.  Patient was asked to identify things she is grateful for and patient stated, "I am grateful for my kids, family, and my health".      Behavioral Observations: Patient was alert, cooperative and expresses self well; she presented with normal mood and mood congruent affect.     Comments: Patient was oriented x3 and actively participated in group.  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 Patient stated that she is anxious to go back to work.      [name]

## 2012-06-16 NOTE — Progress Notes (Signed)
Group 3 : Cognitive Behavioral Therapy     Topic: Mindful Meditation and focusing on Nonjudgmental Stance (NJS)     Time: 11:15 AM to 12:15 pm     Attendance: 60 minutes   Participation: Active   Intervention: Chartered certified accountant, Skill application/rehearsal and Cognitive/behavioral restructuring     Goal: Increased mindfulness, Positive behavioral changes, To increase understanding of when to use judgements and when to let them go.    Response to intervention/skills and/or knowledge gained: Today's session built on the concept discussed yesterday of mindfulness and wise-mind by elaborating on ways to apply mindfulness. The concept of nonjudgmental stance was introduced to patients and each patient was encouraged to examine what judgments they were carrying of themselves that continue to upset them, weigh them down, or hold them back. Pt was introduced to different ways to shed negative judgements about self: specifically by using radical acceptance, daily affirmations, and recognizing cognitive distortions that perpetuate negative self-talk. Patients were also taught the acronym SHOULDS: "Stop holding on to un-warrented longstanding destructive self-talk" as a way to recognize and distance themselves from self-criticisms.  The purpose of mindfulness and the benefits that come with focusing on the presented, rather than the past or future, were discussed. By using these skills and techniques, the patient was able to gain perspective on her thoughts, and see how she can positively influence her feelings and behaviors. Overall she noted to have benefited from the session and learned skills and concepts to mitigate her recurrent judgments that "no one will want to be with me because I am crazy" and was able to recognize that she needs to learn to accept herself more.     Behavioral Observations: Patient was Cooperative, Chief Operating Officer; she presented with anxious and depressed mood and mood-congruent  affect.    Comments: Patient participated actively in discussion, volunteering information to make sure that she understood how NJS can promote more positive thoughts and lead to feeling more empowered. Pt was also challenged to change their judgments into more factual based descriptions, statements of preference, or of consequences.     Charlotte Holland

## 2012-06-16 NOTE — Progress Notes (Addendum)
Treatment Plan Review Session     Date: June 16, 2012  Time: 3:15 - 3:30pm    Participation: Patient was Cooperative, Chief Operating Officer;     Content of session: Involved discussion and counseling supports regarding patient's course of treatment, education on mental health diagnosis and symptoms, progress towards treatment plan goals, and aftercare planning.    Patient was able to engage actively in a discussion regarding care and progress towards stated goals of 1) state my strengths proudly      2) positive ways to cope with anxiety      3) List ways to reframe negative thoughts      4) Safety plan for discharge       5) Identifying self talk and replace it with positive talk    Treatment plan outcomes include patient's participation in Daily Planning, Psychoeducation, Cognitive Behavioral Therapy, Self-Care, Interpersonal Relationships, and Home Care Planning groups daily. she engages in exercises to enhance coping skills, reframe negative thinking, identifying cognitive distortions, and evaluating core beliefs of self and others. Patient is also encouraged to develop and implement a plan of action to avoid relapse as well as identifying triggers that may exacerbate symptoms of her mental health condition. she has been able to engage in the above mentioned activities and in a process for coping with personal stressors, emotional regulation, and managing aspects of her depression and mania symptoms. Patient expressed progress towards goals indicating that her sleep has improved, she has been able to respond well to all prescribed medications, and have experienced a notable improvement in their mood. she can verbalize ways, to which they have been coping more effectively through the use of therapeutic relaxation techniques of deep-breathing and counting exercises, changes in lifestyle and health through maintaining good nutrition, healthy sleep regimen, and practicing positive self-talk and affirmations that  foster healing and recovery. Throughout the session, writer continued to engage patient in a process for identifying healthier ways for relating, managing aspects of her mental health, and for strengthening her ability to continue practicing what he/she has learned and in applying this daily. Overall, patient impressed as being in an action stage of change and will need continued practice/application/resources/support to progress.   Aftercare planning suggestions were made by writer for patient to continue with outpatient providers: therapist and psychiatrist. Resources were provided to patient with listings within her geographic location. Patient does feel as though she is making progress with treatment and remains agreeable to further care with this treatment team. Writer encouraged patient to continue participating in group therapy sessions and a following psychotropic medication regimen as directed by the unit psychiatrist.     Plan: Continue with partial hospitalization treatment for further stabilization and monitor patient's progress toward stated treatment goals. Assess level of readiness for discharge.    For discharge planning, patient is contacting a Dr. Joni Reining DiRienzo of [facility] for medication management appt and will be contacting a counselor named Joycelyn Rua for outpatient counseling.

## 2012-06-16 NOTE — Progress Notes (Signed)
Group 5:  Wrap-Up and self Care  Topic:  Home Care Planning / Enhancing Self-Care Skills    Time:  2:15PM to 3:10 PM      Attendance: 55 minutes    Participation:  Active    Intervention:  Skill application/rehearsal and Cognitive/behavioral restructuring    Goal:   To review learned skills and plan to implement skills for daily use, promote increased mindfulness, encourage positive behavioral changes,    Summary and understanding of how patient will apply skills and knowledge gained today:      See scanned Daily Home-Care planning note (dated 06/16/2012) for specifics on what the pt reported. Pt was also introduced to tips for self-care, where she identified exercising and practicing guided imagery for relaxation as strategies that she will do today for self-care.     Homicidal/Suicidal Ideation: Absent; Patient has a home care plan in place and they were encouraged to utilize as needed. Patient is able to contract for safety and did not endorse any SI/ HI intent or plans. Please refer to SAT daily assessment scores indicated below regarding patient's current level of functioning.    Behavioral Observations:  Presenting Mental Status  Orientation Level: Oriented X4  Memory: No Impairment  Thought Content: normal  Thought Process: normal  Behavior: tearful  Consciousness: Alert  Impulse Control: normal  Perception: normal  Eye Contact: normal  Attitude: cooperative  Mood: depressed;anxious  Hopelessness Affects Goals: No  Hopelessness About Future: No  Affect: angry  Speech: normal  Concentration: normal  Insight: good  Judgment: good  Appearance: normal  Appetite: normal  Weight change?: normal  Energy: decreased  Sleep: normal  Reliability of Reporter/Patient: good     Risk Assessment:  Suicide Assessment Tool (SAT)   Thoughts (Suicidal Ideation): verbalizes no current ideation  Plans (Suicidal Ideation): none to occasional thoughts of suicide with no plan  Method (Suicidal Ideation): none  Impulse Control  (Behavior Cues): inconsistent impulse control  Behavioral Activity (Behavior Cues): some changes in usual behavior patterns  Preparation for Death (Behavior Cues): none  Predominant Mood or Affect: signs of moderate depression or indicators of helplessness  Mood Stability (Mood/Affect): some mood fluctuations, some anxiety/agitation  Tolerance of Feelings (Mood/Affect): feelings periodically distressing  Problem Solving (Cognition/Perception): limited problem solving  Perception (Cognition/Perception): accurate perception of reality     Daily Level of Care Assessment:    Remain at current level of care, with following plan:   Utilize safety/home care plan as needed, Develop/practice coping and relapse prevention skills    Comments: Patient appears to be engaged in the therapeutic process. They will need continued psychiatric treatment to support the use of skills learned for today and for daily application. Patient will return for ongoing care at this time.      Charlotte Holland

## 2012-06-16 NOTE — Progress Notes (Signed)
Group 4:  Interpersonal Relationships    Topic: Conflict Management       Time:  1:15 PM to 2:05 PM       Attendance: 50 minutes    Participation:  Active    Intervention:  Interactive didactic instruction and Skill application/rehearsal    Goal:  Increased mindfulness, Positive behavioral changes, To increase understanding of conflict management styles    Response to intervention/skills and/or knowledge gained:  Patient's participation in group was appropriate. She gained insight into ways to improve communication skills, enhance the patient's self-esteem, identify and express her values, and improve overall assertiveness skills. By working on these skills, the patient reported feeling more empowered to address their over all goal of improving and sustaining gains made towards full mental health recovery. Overall she noted to have benefitted from the session and learned skills and concepts to mitigate her depression. Patients were given a hand-out that explained various conflict management styles. Patient identified her style as being passive. Patient stated, "I avoid conflict at all cost".      Behavioral Observations: Patient was alert and cooperative; she presented with normal mood and mood congruent affect mood.    Comments: Patient was oriented x3 and actively participated in group session. Patient provided feedback to other group members and was receptive to feedback. Patient stated I do not speak up if something is wrong. Patient shared an example of being in a restaurant and her food being cold and she did not share it with the waitress, she just ate the food. Patient stated that she is aware that she needs to learn to be more assertive.     [name]

## 2012-06-17 ENCOUNTER — Inpatient Hospital Stay: Payer: Exclusive Provider Organization | Admitting: Mental Health

## 2012-06-17 LAB — LITHIUM LEVEL: Lithium Level: 0.9 mEq/L — ABNORMAL LOW (ref 1.0–1.2)

## 2012-06-17 NOTE — Progress Notes (Signed)
Group 3: Cognitive Behavioral Therapy    Topic: Distinguishing between productive and unproductive worries    Time: 11:20 AM to 12:15 PM     Attendance: 55 mins   Participation: active, engaged  Intervention: Interactive didactic instruction     Goal: Identification of cognitive distortions, Increased mindfulness, Positive behavioral changes, To increase understanding of healthier ways to manage anxiety    Response to intervention/skills and/or knowledge gained: Patient's participation in group was appropriate. she gained insight into ways to improve the management of anxiety by recognizing productive worries and unproductive worries. The cognitive trap of focusing on things outside of one's control, confusing probability with possibility, and learning to gauge if a worry can lead to a '"to do list" vs. A "what if" list were all discussed.  Pt gained perspective on how to recognize unproductive worry, she learned ways to mitigate the cognitive, emotional, and physical cost of pursuing unhealthy forms of worrying, and what positive and negative consequences they can have for getting your needs met and maintaining positive mental health. Patient's own issues with anxiety were discussed, in particular how she struggles with anxiety, particularly over the prospect of getting fired over her extended absence from work.  Also introduced in the session was the skill of assigning worry time, as a way to compartmentalize, and filter out worries. Pt's were encouraged to set aside a time dedicated to worry, and cease worrying during other times outside of that time. Overall she noted to have benefitted from the session and learned skills and concepts to mitigate her past difficulties in unhealthy worrying, ruminations, and anxiety.      Behavioral Observations: Patient was Cooperative, Chief Operating Officer; she presented with WNL mood and mood-congruent affect.    Comments: Patient asked insightful questions to clarify the  differences among the types of worrying they tend to engage in and offered examples from past experience. she noted that she will reassure herself by knowing that it is within her legal right to retain her work while under [employer]

## 2012-06-17 NOTE — Progress Notes (Addendum)
Group 2:  Psychoeducation      Topic: Sleep Hygiene     Time:  10:05 am to 11:00 am       Attendance: 55 minutes    Participation:Active    Intervention: Interactive didactic instruction    Goal: Positive behavioral changes, To increase understanding of best practices for sleep    Response to intervention/skills and/or knowledge gained:  Patient's participation in group was appropriate. She gained insight into various concepts of mental health to promote positive behavior patterns, sustained medication compliance, better understanding of how the patient's illness came to be, and potential treatment options. Overall she noted to have benefited from the session and learned skills and concepts to aide her treatment goal to mitigate her depression.  Patients were given information and a handout on the effects of poor sleep habits and best practices for sleep. Patient stated that she sometimes has trouble falling asleep and sleeping through the night.  Patient stated that she often takes naps during the day and she utilizes the naps as a form of "social isolation". Patient stated, "I am going to stop taking naps and if I wake up during the night and I am unable to fall back asleep for more than 15 minutes I am going to get out of bed and try something relaxing".     Behavioral Observations: Patient was alert, cooperative, expresses self well and maintains good eye contact; she presented with normal mood and mood congruent affect.      Comments: Patient was oriented x3, pleasant, actively participated in group. Patient was receptive to feedback and provided feedback to others.      [name]

## 2012-06-17 NOTE — Progress Notes (Signed)
Group 5:  Wrap-Up and self Care  Topic:  Home Care Planning / Enhancing Self-Care Skills    Time:  2:10PM to 3:00 PM      Attendance: 50 minutes    Participation:  Active    Intervention:  Skill application/rehearsal and Cognitive/behavioral restructuring    Goal:   To review learned skills and plan to implement skills for daily use, promote increased mindfulness, encourage positive behavioral changes,    Summary and understanding of how patient will apply skills and knowledge gained today:      See scanned Daily Home-Care planning note (dated 06/17/2012) for specifics on what the pt reported. Pt was also introduced to tips for self-care, where she identified going to the gym having dinner and watching a movie as strategies that she will do today for self-care.     Homicidal/Suicidal Ideation: Absent; Patient has a home care plan in place and they were encouraged to utilize as needed. Patient is able to contract for safety and did not endorse any SI/ HI intent or plans. Please refer to SAT daily assessment scores indicated below regarding patient's current level of functioning.    Behavioral Observations:  Presenting Mental Status  Orientation Level: Oriented X4  Memory: No Impairment  Thought Content: normal  Thought Process: normal  Behavior: restless  Consciousness: Alert  Impulse Control: normal  Perception: normal  Eye Contact: normal  Attitude: cooperative  Mood: normal  Hopelessness Affects Goals: No  Hopelessness About Future: No  Affect: fearful  Speech: normal  Concentration: normal  Insight: good  Judgment: good  Appearance: normal  Appetite: normal  Weight change?: normal  Energy: decreased  Sleep: difficulty staying asleep  Reliability of Reporter/Patient: good     Risk Assessment:  Suicide Assessment Tool (SAT)   Thoughts (Suicidal Ideation): verbalizes no current ideation  Plans (Suicidal Ideation): none to occasional thoughts of suicide with no plan  Method (Suicidal Ideation): none  Impulse Control  (Behavior Cues): inconsistent impulse control  Behavioral Activity (Behavior Cues): some changes in usual behavior patterns  Preparation for Death (Behavior Cues): none  Predominant Mood or Affect: signs of moderate depression or indicators of helplessness  Mood Stability (Mood/Affect): some mood fluctuations, some anxiety/agitation  Tolerance of Feelings (Mood/Affect): feelings periodically distressing  Problem Solving (Cognition/Perception): limited problem solving  Perception (Cognition/Perception): accurate perception of reality     Daily Level of Care Assessment:    Remain at current level of care, with following plan:   Utilize safety/home care plan as needed, Develop/practice coping and relapse prevention skills    Comments: Patient appears to be engaged in the therapeutic process. They will need continued psychiatric treatment to support the use of skills learned for today and for daily application. Patient will return for ongoing care at this time.      Johnsie Kindred

## 2012-06-17 NOTE — Progress Notes (Addendum)
Group 4:  Interpersonal Relationships    Topic: Team Building       Time:  1:10 PM to 2:05 PM       Attendance: 55 minutes    Participation:  Active    Intervention:  [place] Scenario    Goal:  To improve communication skills, team building, promote group cohesion    Response to intervention/skills and/or knowledge gained:  Patient's participation in group was appropriate. She gained insight into ways to improve communication skills, enhance the patient's self-esteem, identify and express her values, and improve overall assertiveness skills. By working on these skills, the patient reported feeling more empowered to address their over all goal of improving and sustaining gains made towards full mental health recovery. Overall she noted to have benefitted from the session and learned skills and concepts to mitigate her depression.  Patients were given a scenario that they they had been stranded on a deserted [place]. Patients were asked to name 1 item they could take with them on the [place]. Patient stated, "I would take my TV so we can watch DVDs for entertainment".  Patients were then asked to work together to improve their chances of survival by combining the various items introduced.  Patient actively participated in the group, worked cooperatively in the group with other members and communicated in a positive manner.  Patient took a leadership role in being creative with determining ways to utilize items for survival.      Behavioral Observations: Patient was alert, cooperative, expresses self well and maintains good eye contact; she presented with normal mood and mood congruent affect.    Comments: Patient was oriented x3, pleasant, actively participated in activity. Patient enjoyed the activity and stated, "I haven't laughed that much in 5 months".    [name]

## 2012-06-17 NOTE — Progress Notes (Signed)
Group 1:  Daily Planning  Time:  9:00 am to 9:55 am       Attendance:55 minutes    Participation:  Active    Intervention:  Skill application/rehearsal and Cognitive/behavioral restructuring    Goal:  To set therapeutic goals for the day, review progress towards treatment goals, identify and process through barriers to achieve goals    Content of Session: Pt reported to be feeling: anxious, nervous and fearful.  See scanned Daily group Note dated 06/17/2012 for specifics on pt's mood, symptoms, progress towards treatment goals, and Aftercare planning.     Homicidal/Suicidal Ideation: Absent;      Behavioral Observations:  Presenting Mental Status  Orientation Level: Oriented X4  Memory: No Impairment  Thought Content: normal  Thought Process: normal  Behavior: restless  Consciousness: Alert  Impulse Control: normal  Perception: normal  Eye Contact: normal  Attitude: cooperative  Mood: normal  Hopelessness Affects Goals: No  Hopelessness About Future: No  Affect: fearful  Speech: normal  Concentration: normal  Insight: good  Judgment: good  Appearance: normal  Appetite: normal  Weight change?: normal  Energy: decreased  Sleep: difficulty staying asleep  Reliability of Reporter/Patient: good     Risk Assessment:        [name]

## 2012-06-18 ENCOUNTER — Inpatient Hospital Stay (HOSPITAL_PSYCHIATRIC): Payer: Exclusive Provider Organization | Admitting: Mental Health

## 2012-06-18 NOTE — Progress Notes (Addendum)
Group 1:  Daily Planning  Time:  9:10 am to 10:05 am       Attendance: 55 minutes    Participation: Active    Intervention:  Skill application/rehearsal and Cognitive/behavioral restructuring    Goal:  To set therapeutic goals for the day, review progress towards treatment goals, identify and process through barriers to achieve goals    Content of Session: Pt reported to be feeling: calm.  See scanned Daily group Note dated 06/18/2012 for specifics on pt's mood, symptoms, progress towards treatment goals, and Aftercare planning.     Homicidal/Suicidal Ideation: Absent;      Behavioral Observations:  Presenting Mental Status  Orientation Level: Oriented X4  Memory: No Impairment  Thought Content: normal  Thought Process: normal  Behavior: normal  Consciousness: Alert  Impulse Control: normal  Perception: normal  Eye Contact: normal  Attitude: cooperative  Mood: anxious  Hopelessness Affects Goals: No  Hopelessness About Future: No  Affect: normal  Speech: normal  Concentration: normal  Insight: good  Judgment: good  Appearance: normal  Appetite: normal  Weight change?: normal  Energy: normal  Sleep: difficulty falling asleep  Reliability of Reporter/Patient: good     Risk Assessment:        [name]

## 2012-06-18 NOTE — Patient Instructions (Addendum)
REDUCE DEPAKOTE TO 250 MG IN THE MORNING AND 500 MG AT NIGHT  CONTINUE LITHIUM AT CURRENT DOSE

## 2012-06-18 NOTE — Progress Notes (Signed)
Group 4: Interpersonal Relationships     Topic: Team Building Skills     Time: 1:10 PM to 2:00 PM     Attendance: 50 minutes   Participation: Active   Intervention: Chartered certified accountant, Social Bingo Game     Goal: To improve communication skills; Promote Introspection, personal self-disclosure and facilitate the endorsement of helpful, healthy concepts and build self-esteem.     Response to intervention/skills and/or knowledge gained: Patient's participation in group was appropriate. she participated in social skills game that promoted introspection, self disclosure and practiced new concepts to build her self esteem. Patient noted through her social interactions with others in the group, she gained insight into ways to improve communication skills, enhance her self-esteem, identify and express her values, and improve overall assertiveness skills. By working on these skills, the patient reported feeling more empowered to work on Engineer, production her impulses better." Overall she noted to have benefitted from the session and learned skills and concepts to mitigate her mood swings.     Behavioral Observations: Patient was Cooperative, Chief Operating Officer; she presented with anxious and WNL mood and mood-congruent affect.      Comments: Pt was seen to be enjoying the group experience as evidenced by her ability to interact well with peers, provide supportive comments and appropriate compliments to others.     [name]

## 2012-06-18 NOTE — Progress Notes (Signed)
2:30 pm Patient came to writer crying and angry.  She stated that she wants to quit the program because  "I feel condescended to, I don't feel I'm being listened to."   Writer and psychiatric resident sat down with patient to review her concerns.  Patient was concerned about side effects of depakote.  Writer provided patient a 'patient information' handout on depakote and reviewed side effects with patient.  Patient stated that she did not see any side effects relevant to her.       Despite counseling efforts to reengage patient, patient said she didn't think she was coming back.  Writer encouraged patient to think further about it over the weekend and patient replied, "Okay."

## 2012-06-18 NOTE — Addendum Note (Signed)
Addended by: Venita Sheffield on: 06/18/2012 12:48 PM     Modules accepted: Level of Service

## 2012-06-18 NOTE — Progress Notes (Addendum)
INTERVAL HISTORY   Patient interviewed for clinical follow-up. Patient describes her mood as "a lot better." Endorses a 4 day h/o increased  restlessness in her roes only (described as "like my feet have frostbite").She states this is 'not akathisia, I know what akathisia is'. Also expresses concern that her fingers "look a little blue and discolored today." She also reports poor sleep as a result of this restlessness, stating that "once I lay down at night I feel restless and have a hard time getting to sleep. I'm afraid to go to sleep." Expresses concern that these complaints may be secondary to Depakote, and she states that she is unable to tolerate them and would like to get off the medication. She reports that she plans to self-discontinue the Depakote over the weekend and would like something else to stabilize her mood. Reports that she previously responded well to Cymbalta "but I guess it stopped working." Lithium level reviewed with patient - who voices understanding, She also states that she would be willing to increase Lithium and/or add another medication for mood, but she does not want to continue Depakote. Spoke with patient at great length about the risk of abrupt cessation of medications (increased risk of mood destabilization and seizures). Also processed with the patient her concerns regarding whether or not her Depakote "is toxic," and went over signs and symptoms of toxicity. Offered to increase Lithium to maintain mood stabilization, but patient declined, stating that her current Lithium level (0.9) was "a good one for me," and that she has concerns about Lithium toxicity at higher doses. States she will work with her outpatient psychiatrist to adjust Lithium dosage, and that the patient would be amenable with a controlled decrease of Depakote in order to minimize risks described as above). She expressed considerable frustration with this plan, but was in agreement.    Otherwise, the patient  peports feeling positive and more hopeful for the future and feels that the groups are helping her improve coping skills and decrease negative thoughts. She appears initially pleasant, later irritable, tearful, and with poor rapport when discussing her medication concerns.    Denies suicidal ideation/intent or plan   No homicidal ideation/intent or plan   No delusions or hallucinations     Side effects:see above    Current Outpatient Prescriptions   Medication Sig Dispense Refill   . divalproex EC/DR (DEPAKOTE EC/DR) 500 MG EC tablet Take 250mg  PO by mouth in the morning, 500 mg by mouth at night x 2 days, then just 500mg  PO QHS       . hydrOXYzine (VISTARIL) 25 MG capsule Take 50 mg by mouth 3 (three) times daily as needed.       . lithium 600 MG capsule Take 600 mg by mouth 2 (two) times daily.         MSE   1. Appearance and Behavior: Casually-dressed, fairly cooperative with good eye contact; poor rapport  2. Speech:   - Rate/rhythm: Normal   - Volume: Normal   - Speech: Normal, spontaneous  3. Emotional State   - Mood: "ok"   - Affect: irritable  4. Thought Process: Linear, goal-directed   - Content: Negative for SI, HI, negative for delusions; hopeful, future-oriented  5. Sensorium/Mental Capacity:   - Perception: Denies AVH   - Orientation: AO x 4   - Memory: remote, recent, and immediate memory intact   6. Insight/Judgment:   - Insight: fair  - Judgment: fair - appropriate for safety  ASSESSMENT   Axis I: Bipolar DO NOS, MRE mania, possibly medication related?, h/o ETOH-dep (in remission)   Axis II: Borderline traits  Axis III: No past surgical history on file.   Axis IV: Occupational problems   Other psychosocial or environmental problems   Problems related to social environment   Problems with access to health care services   Problems with primary support group   Axis V: GAF = 50    PLAN   - Continue PHP Hospitalization for safety, mood stabilization and medication management   - Lithium level 0.9. -  Continue Lithium at current dosage. Decrease VPA by 250mg  by 2 days, followed by another 250mg  decrease.  Continue Vistaril to 50 mg tid prn anxiety  - Continue to encourage participation in groups and compliance with medications.   -Discussed risks and benefits of treatment options as well as potential side effects and medication interactions. Patient agreed to continue with safety plan which includes calling 911 and/or going to ER if needed. All questions answered and concerns addressed.     Total time spent on patient care (including chart review, discussion with staff): 45 min   Face to face time: >50%     Attending note:    I saw and evaluated the patient. I performed the critical portion of the service (history, mental status exam and medical decision-making).     I agree with the findings and plan of care as documented in the resident's note   In addition, Mental Status examination is  negative for suicidal or homicidal ideation/intent or plan, no delusions, no auditory or visual hallucinations. She is somewhat irritable, believes she is 'not being heard' and that she is upset that she is taking Depakote.I advised that she should taper the Depakote under medical supervision and not stop an anticonvulsant suddenly. She states she needs to look in her bag to see the formulation of the Depakote and then refused to come back into the room. Ms Danielle Dess , RN met with patient subsequently and she is scheduled to return to [facility] on Monday      Discussed the R/B/A of meds with patient, including black box warnings, continue PHP for stabilization    Total floor time 45 min, >50% of which was spent in direct patient contact    Navneet K Baltazar Apo

## 2012-06-18 NOTE — Progress Notes (Signed)
INTERVAL HISTORY   Patient interviewed with NP student [facility] June So. Patient describes her mood as "it's ok". She states that for the first time last night, she saw a "sliver of sunlight as opposed to just exist" and was feeling hopeful about the future. She thought "Maybe I will have friends, and I'm going to have a great life once I get healthy." In addition, she feels today's groups have been the most helpful yet. She has found that the session on judging was very useful as she realizes "I'm my own worst enemy". She has been able to learn techniques to turn that around. In addition, she is looking forward to reaching out to friends on a more regular basis as opposed to isolating herself socially. She realizes her daughter will be going to college in 6 months, and while it will be an adjustment, she no longer feels hopeless about being alone. She has spent 22 years raising her three children, and is looking forward to the future.     She reports she has been on Lithium for about 8 years and the Depakote was recently added. She is not sure if that is the cause of her "toes being twitchy." She states "it's not akathisia, as I've had that before and it's horrible." She doesn't know if it's a side effect of her medications, or if it is her anxiety.   She appears pleasant, cooperative, with bright affect and expresses herself well throughout the interview.     Denies suicidal ideation/intent or plan   No homicidal ideation/intent or plan   No delusions or hallucinations     Side effects:none    Current Outpatient Prescriptions   Medication Sig Dispense Refill   . divalproex EC/DR (DEPAKOTE EC/DR) 500 MG EC tablet Take 500 mg by mouth 2 (two) times daily.       . hydrOXYzine (VISTARIL) 25 MG capsule Take 25 mg by mouth 3 (three) times daily as needed.       . lithium 600 MG capsule Take 600 mg by mouth 2 (two) times daily.         MSE   1. Appearance and Behavior: Casually-dressed, cooperative with good eye contact;  pleasant  2. Speech:   - Rate/rhythm: Normal   - Volume: Normal   - Speech: Normal, spontaneous  3. Emotional State   - Mood: "ok"   - Affect: euthymic, mood-congruent  4. Thought Process: Linear, logical, goal-directed   - Content: Negative for SI, HI, negative for delusions; hopeful, future-oriented  5. Sensorium/Mental Capacity:   - Perception: Denies AVH   - Orientation: AO x 4   - Memory: remote, recent, and immediate memory intact   6. Insight/Judgment:   - Insight: Good   - Judgment: Good     ASSESSMENT   Axis I: Bipolar DO NOS, MRE mania, possibly medication related?;, h/o ETOH-dep (in remission)   Axis II: Deferred   Axis III: No past surgical history on file.   Axis IV: Occupational problems   Other psychosocial or environmental problems   Problems related to social environment   Problems with access to health care services   Problems with primary support group   Axis V: GAF = 50    PLAN   - Continue PHP Hospitalization for safety, mood stabilization and medication management   - Obtain Lithium level to determine if in therapeutic range. Lab instructions and referral given to patient. Increase Vistaril to 50 mg tid prn anxiety  - Continue  to encourage participation in groups and compliance with medications.   -Discussed risks and benefits of treatment options as well as potential side effects and medication interactions. Patient agreed to continue with safety plan which includes calling 911 and/or going to ER if needed. All questions answered and concerns addressed.     Total time spent on patient care (including chart review, discussion with staff): 35 min   Face to face time: >50%

## 2012-06-18 NOTE — Progress Notes (Signed)
Group 5:  Wrap-Up and self Care  Topic:  Home Care Planning / Enhancing Self-Care Skills    Time:  2:20 PM to 3:15PM      Attendance: 10 minutes    Participation:  Active    Intervention:  Skill application/rehearsal and Cognitive/behavioral restructuring    Goal:   To review learned skills and plan to implement skills for daily use, promote increased mindfulness, encourage positive behavioral changes,    Summary and understanding of how patient will apply skills and knowledge gained today:      See scanned Daily Home-Care planning note (dated 06/18/2012) for specifics on what the pt reported. Pt was also introduced to tips for self-care, where she identified reframing as strategies that she will do today for self-care.     Homicidal/Suicidal Ideation: Absent; Patient has a home care plan in place and they were encouraged to utilize as needed. Patient is able to contract for safety and did not endorse any SI/ HI intent or plans. Please refer to SAT daily assessment scores indicated below regarding patient's current level of functioning.    Behavioral Observations:  Presenting Mental Status  Orientation Level: Oriented X4  Memory: No Impairment  Thought Content: normal  Thought Process: normal  Behavior: normal  Consciousness: Alert  Impulse Control: normal  Perception: normal  Eye Contact: normal  Attitude: cooperative  Mood: anxious  Hopelessness Affects Goals: No  Hopelessness About Future: No  Affect: normal  Speech: normal  Concentration: normal  Insight: good  Judgment: good  Appearance: normal  Appetite: normal  Weight change?: normal  Energy: normal  Sleep: difficulty falling asleep  Reliability of Reporter/Patient: good     Risk Assessment:        Daily Level of Care Assessment:    Remain at current level of care, with following plan:   Utilize safety/home care plan as needed, Patient has copy of plan, Identify signs/symptoms of illness, Develop/practice coping and relapse prevention skills, Identify/develop  support system    Comments: Patient appears to be engaged in the therapeutic process. They will need continued psychiatric treatment to support the use of skills learned for today and for daily application. Patient will return for ongoing care at this time.      [name]

## 2012-06-18 NOTE — Progress Notes (Addendum)
Group 3 :  Cognitive Therapy    Topic: Challenging Irrational Thoughts      Time:  11:15 AM to 12:10 PM       Attendance: 55 minutes    Participation:  Passive    Intervention:  Chartered certified accountant and Cognitive/behavioral restructuring, Process    Goal:  Identification of cognitive distortions, Positive behavioral changes, To review learned skills and plan to implement skills for daily use    Response to intervention/skills and/or knowledge gained:  Patient's participation in group was appropriate. She gained insight into ways to re-framing negative thought patterns that promote automatic negative thoughts, limit problem solving, and fuel irrational thinking patterns. Through dialogue, patient demonstrated the ability to successfully  address her treatment goal  "to work on stop beating myself".By using CBT based skills and techniques, she was able to process obstacles that got into her way of her rational thinking. Overall, she acknowledged learning new skills/technqiues to challenge her negative self talk will mitigate her depression, and improve feelings of self worth".       Behavioral Observations: Patient was Cooperative, Engaged, Pleasant and Passive; she presented with anxious and depressed mood and normal affect.      Comments: Charlotte Holland

## 2012-06-19 LAB — VALPROIC ACID LEVEL, TOTAL AND FREE
Valproic Acid: 60.5 mg/L (ref 50.0–100.0)
Valproic acid, Free: 7.6 mg/L (ref 4.8–17.3)

## 2012-06-21 ENCOUNTER — Inpatient Hospital Stay: Payer: Exclusive Provider Organization

## 2012-06-21 ENCOUNTER — Telehealth: Payer: Self-pay | Admitting: Professional

## 2012-06-21 NOTE — Progress Notes (Addendum)
Group 2:  Psychoeducation      Topic: positive affirmation boxes     Time:  10:10 am to 11:05 am       Attendance: 50 minutes    Participation:Active    Intervention: use of positive self affirmations to change negative thoughts    Goal: To increase understanding of and/or practice social skills, mindfulness through art, and creation and use of positive affirmations    Response to intervention/skills and/or knowledge gained: Patients spent this session writing positive affirmations or using positive affirmations on a provided template. Patients were encouraged to choose affirmations they would like to believe and currently believe. Patients were reminded of the reason to use positive affirmations and encouraged to say the positive affirmations aloud to themselves at least once a day. Patients then decorated a box to hold their affirmations and read the affirmations they chose aloud to the group.    Behavioral Observations: Patient was Cooperative, Chief Operating Officer; she presented with agitated mood and constricted affect.    Comments: Charlotte Holland shared the positive affirmations that she had selected for her box. She said "I am  loved. I can get better. I choose exercise and make other healthy choices."

## 2012-06-22 ENCOUNTER — Inpatient Hospital Stay: Payer: Exclusive Provider Organization

## 2012-06-22 NOTE — Addendum Note (Signed)
Addended by: Venita Sheffield on: 06/22/2012 03:18 PM     Modules accepted: Level of Service

## 2012-06-23 ENCOUNTER — Inpatient Hospital Stay: Payer: Exclusive Provider Organization

## 2012-06-24 ENCOUNTER — Inpatient Hospital Stay: Payer: Exclusive Provider Organization

## 2012-06-25 ENCOUNTER — Inpatient Hospital Stay: Payer: Exclusive Provider Organization

## 2012-06-28 ENCOUNTER — Inpatient Hospital Stay: Payer: Exclusive Provider Organization

## 2012-06-29 ENCOUNTER — Inpatient Hospital Stay: Payer: Exclusive Provider Organization

## 2012-06-30 ENCOUNTER — Inpatient Hospital Stay: Payer: Exclusive Provider Organization

## 2012-07-12 NOTE — Progress Notes (Signed)
PHP THERAPIST DISCHARGE SUMMARY    Patient's Name: Charlotte Holland    Discharge Date:  June 18, 2012       Treatment plan goals and level of achievement:   Patient's stated treatment goals identified:    1) state my strengths proudly      2) positive ways to cope with anxiety      3) List ways to reframe negative thoughts      4) Safety plan for discharge       5) Identifying self talk and replace it with positive talk    ". she was discharged on June 18, 2012 following 8 days of treatment rendered. During her stay, she was also evaluated by attending physician Dr. Baltazar Apo to address her daily functioning and psychological well being. she was provided education regarding the safe use of psychotropic medications and encouraged to address her response to the medicines and any experienced side effects with the attending psychiatrist and unit nurse. Patient was encouraged to attend group therapy sessions which offered education, skills training, counseling guidance and support for addressing above mentioned goals and working towards resolution of them. she received individual counseling supports to review her responses to risk assessments and address any identified suicidal thinking, intentions for self-harming behaviors. Charlotte Holland participated in cognitive behavioral therapy interventions that focused on challenging negative core beliefs about self, cognitive distortions, reframing approaches/techniques, processing thoughts and feelings to address her diagnosis of Major Depressive Disorder and in an effort to achieve psychiatric stabilization of her acute mental health symptoms.   Patient was also provided therapies that focused on DBT skill training exercises and process groups focused on the following areas: Core Mindfulness, Emotional Regulation, Distress Tolerance, and Interpersonal Effectiveness.     Tocara received care from the treatment team that effectively addressed any presenting medical  conditions as well as daily social/occupational functioning. We worked with the patient on relapse prevention planning to avoid hospitalization and decompensation.  The focus of the session involved discussion regarding course of treatment, education on mental health diagnosis/symptoms, progress towards her stated treatment plan goals, safety planning, and aftercare planning.    Response to treatment: Throughout patient's stay, she was able to engage in a process of therapeutic healing with this treatment team seeking support and guidance whenever possible.   Patient made improvement towards her goals, notably declining to date until she feels 'ready for a quality partner,' resuming exercise, and improving communication with her daughter by using PHP-taught skills on genuine listening.      Overall, patient was successful towards verbalizing improved insight and understanding of her mental health diagnosis, symptoms, and medication regimen. she was educated on concepts for creating new patterned ways of thinking to reframe negative thoughts. she remains in the skill application phases of the concepts that were introduced to her throughout her care with [facility], and will need additional support/practice/resources to move further along in her recovery process. In terms of aftercare planning, she was encouraged to follow-up with an outpatient therapist and psychiatrist to reinforce application to avoid/minimize her chances of a relapse or hospitalization in the future. she identified the following providers as her choice for continued care: Med management: Dr. Evie Lacks at [facility] and therapist: Joycelyn Rua.  Because patient administratively discharged, she did not provide appt dates/times/numbers.     Discharge instructions reviewed with the patient by the attending physician.   Safety was assessed prior to discharge.    Risk Assessment:  Suicidal Ideation:  None  reported/observed    Homicidal Ideation:  None reported/observed    Self-Harm Behavior:  None reported/observed    Other risk behavior:  None reported    Treatment provided:  Group therapies  Family interventions   Crisis counseling supports  Daily Risk Assessments   Safety Planning  Relapse Prevention Planning    Recommendations:     Individual therapy   Continue Medication Management with an outpatient psychiatrist    Comments:  Patient has aftercare plan in place per discharge instructions.    Aquilla Solian

## 2013-12-08 NOTE — Telephone Encounter (Signed)
Telephone call was made to patient.

## 2013-12-28 DIAGNOSIS — Z8371 Family history of colonic polyps: Secondary | ICD-10-CM | POA: Insufficient documentation

## 2014-02-13 ENCOUNTER — Ambulatory Visit: Payer: Self-pay | Admitting: Unknown Physician Specialty

## 2015-03-05 ENCOUNTER — Ambulatory Visit: Payer: Self-pay | Admitting: Physician Assistant

## 2015-03-05 ENCOUNTER — Encounter: Payer: Self-pay | Admitting: Physician Assistant

## 2015-03-05 VITALS — BP 142/79 | HR 73 | Temp 98.0°F

## 2015-03-05 DIAGNOSIS — E785 Hyperlipidemia, unspecified: Secondary | ICD-10-CM

## 2015-03-05 DIAGNOSIS — I10 Essential (primary) hypertension: Secondary | ICD-10-CM

## 2015-03-05 DIAGNOSIS — R079 Chest pain, unspecified: Secondary | ICD-10-CM

## 2015-03-05 NOTE — Progress Notes (Signed)
S: had an episode of chest pain this weekend while working at [employer], pain in r arm, did not get diaphoretic, no radiation through to back, pain located at base of sternum, only last few seconds to a minute; states has not had same pain since incident, does not feel bad, denies weakness or dizziness; is concerned as has family hx of CAD.  Pt has hx of HTN, hyperlipidemia, former smoker quit years ago  O: vitals with elevated bp , otherwise wnl, normocephalic, ent wnl, neck supple no lymph, lungs c t a, cv rrr no mrg; no bruits noted at carotids, ekg nsr  A: nonspecific chest pain  P: refer to cardiology for nonspecific chest pain, ?stress test needed as pt is 54 with htn and hyperlipidemia; instructed pt to increase lisinopril/hctz to 2 pills a day

## 2015-03-06 DIAGNOSIS — R0789 Other chest pain: Secondary | ICD-10-CM | POA: Insufficient documentation

## 2015-03-27 ENCOUNTER — Ambulatory Visit: Payer: Self-pay | Admitting: Physician Assistant

## 2015-03-27 ENCOUNTER — Encounter: Payer: Self-pay | Admitting: Physician Assistant

## 2015-03-27 VITALS — BP 130/79 | HR 81 | Temp 98.3°F

## 2015-03-27 DIAGNOSIS — IMO0001 Reserved for inherently not codable concepts without codable children: Secondary | ICD-10-CM | POA: Insufficient documentation

## 2015-03-27 DIAGNOSIS — J309 Allergic rhinitis, unspecified: Secondary | ICD-10-CM | POA: Insufficient documentation

## 2015-03-27 DIAGNOSIS — N939 Abnormal uterine and vaginal bleeding, unspecified: Secondary | ICD-10-CM

## 2015-03-27 DIAGNOSIS — R300 Dysuria: Secondary | ICD-10-CM

## 2015-03-27 DIAGNOSIS — K2980 Duodenitis without bleeding: Secondary | ICD-10-CM | POA: Insufficient documentation

## 2015-03-27 LAB — POCT URINALYSIS DIPSTICK
Bilirubin, UA: NEGATIVE
Glucose, UA: NEGATIVE
Ketones, UA: NEGATIVE
Leukocytes, UA: NEGATIVE
NITRITE UA: NEGATIVE
PH UA: 5.5
Protein, UA: NEGATIVE
RBC UA: NEGATIVE
Spec Grav, UA: 1.01
UROBILINOGEN UA: 0.2

## 2015-03-27 NOTE — Addendum Note (Signed)
Addended by: Faythe GheeFISHER, SUSAN W on: 03/27/2015 12:24 PM   Modules accepted: Orders

## 2015-03-27 NOTE — Progress Notes (Signed)
S: c/o seeing blood in her underwear, small amount like spotting, did have some burning with urination on Saturday but none now, no fever/chills, is having some back pain also, hx of total hysterectomy, use to see dr Luella Cookrosenow but hasn't in years  O: vitals wnl, nad, lungs c t a, cv rrr, ua wnl  A: vaginal bleeding  P: will culture urine, f/u with gyn asap

## 2015-03-29 LAB — URINE CULTURE

## 2015-03-29 NOTE — Progress Notes (Signed)
I spoke with the patient about her lab results and she expressed understanding. 

## 2015-05-04 ENCOUNTER — Encounter: Payer: Self-pay | Admitting: Physician Assistant

## 2015-05-04 ENCOUNTER — Ambulatory Visit: Payer: Self-pay | Admitting: Physician Assistant

## 2015-05-04 VITALS — BP 130/90 | HR 75 | Temp 97.9°F

## 2015-05-04 DIAGNOSIS — M25561 Pain in right knee: Secondary | ICD-10-CM

## 2015-05-04 MED ORDER — MELOXICAM 15 MG PO TABS: 15.0000 mg | ORAL_TABLET | Freq: Every day | ORAL | Status: DC

## 2015-05-04 NOTE — Progress Notes (Signed)
Patient ID: Diane Stephenson, female   DOB: November 05, 1960, 55 y.o.   MRN: [id number] Patient has been referred to see Dr. Antoine PrimasZachary Bruno at [facility] [place] for 05/13/2014 at 2:30 per Diane Stephenson's authorization.  Patient has been notified.

## 2015-05-04 NOTE — Progress Notes (Signed)
S: c/o pain in r knee, swelling, had pain prior to exercising, then walked 2 miles on the treadmill and pain/swelling increased, ibuprofen helping, used ice, ?what to do now  O: vitals wnl, nad, r knee swollen with knot in r anterior thigh, full rom with extension , joint line is nontender, n/v intact  A: suprapatellar bursitis, acute knee pain  P: mobic 15mg  , refer to sports med, continue ice, restrict activity until evaluated by sports med

## 2015-05-14 ENCOUNTER — Ambulatory Visit (INDEPENDENT_AMBULATORY_CARE_PROVIDER_SITE_OTHER)
Admission: RE | Admit: 2015-05-14 | Discharge: 2015-05-14 | Disposition: A | Discharge status: Home / Self Care | Payer: Managed Care, Other (non HMO) | Source: Ambulatory Visit | Attending: Family Medicine | Admitting: Family Medicine

## 2015-05-14 ENCOUNTER — Encounter: Payer: Self-pay | Admitting: Family Medicine

## 2015-05-14 ENCOUNTER — Other Ambulatory Visit (INDEPENDENT_AMBULATORY_CARE_PROVIDER_SITE_OTHER): Payer: Managed Care, Other (non HMO)

## 2015-05-14 ENCOUNTER — Ambulatory Visit (INDEPENDENT_AMBULATORY_CARE_PROVIDER_SITE_OTHER): Payer: Managed Care, Other (non HMO) | Admitting: Family Medicine

## 2015-05-14 VITALS — BP 132/82 | HR 70 | Ht 63.0 in | Wt 235.0 lb

## 2015-05-14 DIAGNOSIS — M25561 Pain in right knee: Secondary | ICD-10-CM

## 2015-05-14 DIAGNOSIS — M129 Arthropathy, unspecified: Secondary | ICD-10-CM | POA: Diagnosis not present

## 2015-05-14 DIAGNOSIS — M7150 Other bursitis, not elsewhere classified, unspecified site: Secondary | ICD-10-CM | POA: Diagnosis not present

## 2015-05-14 DIAGNOSIS — M222X1 Patellofemoral disorders, right knee: Secondary | ICD-10-CM | POA: Diagnosis not present

## 2015-05-14 DIAGNOSIS — M222X9 Patellofemoral disorders, unspecified knee: Secondary | ICD-10-CM | POA: Insufficient documentation

## 2015-05-14 DIAGNOSIS — M7050 Other bursitis of knee, unspecified knee: Secondary | ICD-10-CM

## 2015-05-14 DIAGNOSIS — M1711 Unilateral primary osteoarthritis, right knee: Secondary | ICD-10-CM | POA: Insufficient documentation

## 2015-05-14 MED ORDER — DICLOFENAC SODIUM 2 % TD SOLN: TRANSDERMAL | Status: DC

## 2015-05-14 NOTE — Progress Notes (Signed)
[name] [address] [place], [place] [address] Phone: [phone] Subjective:    I'm seeing this patient by the request  of:  Greig Right NP  CC: Right knee pain  BJY:[id number] Diane Stephenson is a 55 y.o. female coming in with complaint of knee pain. Patient has had this pain for quite some time but then started working on a more regular basis. Patient states with walking approximately 2 miles patient does get increased swelling. Has been taking anti-inflammatories which has been helpful as well as icing. Patient states though it never seems to go away. Patient's on the provider and they stated that inflammation seem to be on the anterior aspect of the knee. Questionable suprapatellar bursitis. Patient given meloxicam. Patient states  it seems to be above the knee. States pain seems to be worse when she is going up or downstairs. Patient states that he is eating given her trouble at night. Even regular ambulation is starting to give her trouble. States that there is a fullness feeling. An states that some trouble with flexion. Denies any radiation down the leg or any numbness. Rates the severity of discomfort as 7 out of 10. Seems to be worsening slowly.     Past Medical History  Diagnosis Date  . Hypertension   . Hyperlipidemia    No past surgical history on file. Social History   Social History  . Marital Status: Married    Spouse Name: N/A  . Number of Children: N/A  . Years of Education: N/A   Social History Main Topics  . Smoking status: Former Games developer  . Smokeless tobacco: None  . Alcohol Use: None  . Drug Use: None  . Sexual Activity: Not Asked   Other Topics Concern  . None   Social History Narrative   Allergies  Allergen Reactions  . Septra [Sulfamethoxazole-Trimethoprim] Rash   No family history on file.  Past medical history, social, surgical and family history all reviewed in electronic medical record.  No pertanent  information unless stated regarding to the chief complaint.   Review of Systems: No headache, visual changes, nausea, vomiting, diarrhea, constipation, dizziness, abdominal pain, skin rash, fevers, chills, night sweats, weight loss, swollen lymph nodes, body aches, joint swelling, muscle aches, chest pain, shortness of breath, mood changes.   Objective Blood pressure 132/82, pulse 70, height 5\' 3"  (1.6 m), weight 235 lb (106.595 kg), SpO2 96 %.  General: No apparent distress alert and oriented x3 mood and affect normal, dressed appropriately.  HEENT: Pupils equal, extraocular movements intact  Respiratory: Patient's speak in full sentences and does not appear short of breath  Cardiovascular: No lower extremity edema, non tender, no erythema  Skin: Warm dry intact with no signs of infection or rash on extremities or on axial skeleton.  Abdomen: Soft nontender  Neuro: Cranial nerves II through XII are intact, neurovascularly intact in all extremities with 2+ DTRs and 2+ pulses.  Lymph: No lymphadenopathy of posterior or anterior cervical chain or axillae bilaterally.  Gait normal with good balance and coordination.  MSK:  Non tender with full range of motion and good stability and symmetric strength and tone of shoulders, elbows, wrist, hip, and ankles bilaterally.  Knee: Right  Normal to inspection with no erythema or effusion or obvious bony abnormalities. Tender to palpation over the medial joint line patient also has pain over the pes anserine bursa ROM full in flexion and extension and lower leg rotation. Ligaments with solid  consistent endpoints including ACL, PCL, LCL, MCL. Negative Mcmurray's, Apley's, and Thessalonian tests.  painful patellar compression with lateral tracking noted Patellar glide with moderate crepitus. Patellar and quadriceps tendons unremarkable. Hamstring and quadriceps strength is normal.  Contralateral knee has lateral tracking of the patella as well as but  nontender on exam.  MSK US performed of: right knee  This study was ordered, performed, and interpreted by Terrilee FilesZach Capaldi D.O.  Knee: All structures visualized. Patient does have moderate to severe narrowing of the medial joint line. Trace effusion noted of the suprapatellar pouch. Moderate narrowing of the patellofemoral joint Patellar Tendon unremarkable on long and transverse views without effusion. Patient is answering area does have hypoechoic changes and increasing Doppler flow. No abnormality of prepatellar bursa. LCL and MCL unremarkable on long and transverse views. No abnormality of origin of medial or lateral head of the gastrocnemius.  IMPRESSION:  Trace effusion with moderate narrowing of the medial joint line and pes anserine bursitis  Procedure: Real-time Ultrasound Guided Injection of right knee Device: GE Logiq E  Ultrasound guided injection is preferred based studies that show increased duration, increased effect, greater accuracy, decreased procedural pain, increased response rate, and decreased cost with ultrasound guided versus blind injection.  Verbal informed consent obtained.  Time-out conducted.  Noted no overlying erythema, induration, or other signs of local infection.  Skin prepped in a sterile fashion.  Local anesthesia: Topical Ethyl chloride.  With sterile technique and under real time ultrasound guidance: With a 22-gauge 2 inch needle patient was injected with 4 cc of 0.5% Marcaine and 1 cc of Kenalog 40 mg/dL. This was from a superior lateral approach.  Completed without difficulty  Pain immediately resolved suggesting accurate placement of the medication.  Advised to call if fevers/chills, erythema, induration, drainage, or persistent bleeding.  Images permanently stored and available for review in the ultrasound unit.  Impression: Technically successful ultrasound guided injection. Estimated body mass index is 41.64 kg/(m^2) as calculated from the  following:   Height as of this encounter: 5\' 3"  (1.6 m).   Weight as of this encounter: 235 lb (106.595 kg).  Procedure note 97110; 15 minutes spent for Therapeutic exercises as stated in above notes.  This included exercises focusing on stretching, strengthening, with significant focus on eccentric aspects.  Flexion and extension exercises given to patient today focusing on eccentric's, vastus medialis oblique strengthening hip abductor stabilization. Proper technique shown and discussed handout in great detail with ATC.  All questions were discussed and answered.     Impression and Recommendations:     This case required medical decision making of moderate complexity.      Note: This dictation was prepared with Dragon dictation along with smaller phrase technology. Any transcriptional errors that result from this process are unintentional.

## 2015-05-14 NOTE — Assessment & Plan Note (Signed)
Patient injected today and tolerated the procedure well. Patient is having difficult he with the meloxicam and we will change her to a topical anti-inflammatory. We discussed over-the-counter medicines. Patient work with Event organiserathletic trainer today to learn home exercises in greater detail. Patient given the x-ray downstairs. Patient will come back and see me again in 3 weeks for further evaluation. Depending on x-ray we may need to consider formal physical therapy, custom bracing, and she could be a candidate for viscous supplementation. Discussed which exercises would be more beneficial.

## 2015-05-14 NOTE — Progress Notes (Signed)
Pre visit review using our clinic review tool, if applicable. No additional management support is needed unless otherwise documented below in the visit note. 

## 2015-05-14 NOTE — Assessment & Plan Note (Signed)
Conjoining to the difficulty the patient is having. We discussed hamstring exercises as well as stretching and eccentric exercises. We discussed icing regimen. Patient will come back and if continuing to have difficulty we may need to consider another injection.

## 2015-05-14 NOTE — Patient Instructions (Signed)
Good to see you Diane Stephenson 20 minutes 2 times daily. Usually after activity and before bed. pennsaid pinkie amount topically 2 times daily as needed.  Exercises 3 times a week.  Consider vitamin D 2000 IU daily Consider turmeric 500mg  twice daily for knee pain Ok to start biking or swimming in 2 days and only 3 times a week. Diane Stephenson afterward.  Xray downstairs today Come back in 3 weeks and we will see how you are doing

## 2015-05-30 ENCOUNTER — Encounter: Payer: Self-pay | Admitting: Physician Assistant

## 2015-05-30 ENCOUNTER — Ambulatory Visit: Payer: Self-pay | Admitting: Physician Assistant

## 2015-05-30 VITALS — BP 120/80 | HR 82 | Temp 98.3°F

## 2015-05-30 DIAGNOSIS — J069 Acute upper respiratory infection, unspecified: Secondary | ICD-10-CM

## 2015-05-30 MED ORDER — CEFDINIR 300 MG PO CAPS: 300.0000 mg | ORAL_CAPSULE | Freq: Two times a day (BID) | ORAL | Status: DC

## 2015-05-30 NOTE — Progress Notes (Signed)
S: C/o runny nose and congestion for 3 days with dry hacking cough and hoarse voice, no fever, chills, cp/sob, v/d; mucus was green this am; cough is keeping her awake at night  Using otc meds: mucinex  O: PE:  Vitals wnl, nad,  perrl eomi, normocephalic, tms dull, nasal mucosa red and swollen, throat injected, neck supple no lymph, lungs c t a, cv rrr, neuro intact  A:  Acute  uri   P: omnicef  bid x 10d, tussionex nr;  drink fluids, continue regular meds , use otc meds of choice, return if not improving in 5 days, return earlier if worsening

## 2015-06-01 ENCOUNTER — Other Ambulatory Visit: Payer: Self-pay | Admitting: Physician Assistant

## 2015-06-04 ENCOUNTER — Encounter: Payer: Self-pay | Admitting: Family Medicine

## 2015-06-04 ENCOUNTER — Ambulatory Visit (INDEPENDENT_AMBULATORY_CARE_PROVIDER_SITE_OTHER): Payer: Managed Care, Other (non HMO) | Admitting: Family Medicine

## 2015-06-04 ENCOUNTER — Other Ambulatory Visit: Payer: Self-pay | Admitting: Emergency Medicine

## 2015-06-04 VITALS — BP 136/82 | HR 84 | Ht 63.0 in | Wt 230.0 lb

## 2015-06-04 DIAGNOSIS — M222X1 Patellofemoral disorders, right knee: Secondary | ICD-10-CM

## 2015-06-04 DIAGNOSIS — M1711 Unilateral primary osteoarthritis, right knee: Secondary | ICD-10-CM

## 2015-06-04 DIAGNOSIS — M129 Arthropathy, unspecified: Secondary | ICD-10-CM | POA: Diagnosis not present

## 2015-06-04 DIAGNOSIS — M7050 Other bursitis of knee, unspecified knee: Secondary | ICD-10-CM

## 2015-06-04 DIAGNOSIS — M7150 Other bursitis, not elsewhere classified, unspecified site: Secondary | ICD-10-CM | POA: Diagnosis not present

## 2015-06-04 MED ORDER — LISINOPRIL-HYDROCHLOROTHIAZIDE 10-12.5 MG PO TABS: 1.0000 | ORAL_TABLET | Freq: Every day | ORAL | Status: DC

## 2015-06-04 NOTE — Telephone Encounter (Signed)
Patient stopped by and expressed that she needs a refill on her blood pressure medicine sent to [facility] on [address].

## 2015-06-04 NOTE — Patient Instructions (Signed)
Good to see you  Tried a different injection today  PT will be calling you  Continue with the vitamins We will get you a brace and they should be calling you  See me again in 3 weeks and if not better we can try the other injections (monovisc or Orthovisc, but please ask yor insurance first and then call to make sure we have some in stock.

## 2015-06-04 NOTE — Progress Notes (Signed)
[facility] [address] [place], [place] [address] Phone: [phone] Subjective:    CC: Right knee pain f/u  Diane Stephenson is a 55 y.o. female coming in with complaint of knee pain.  Right-sided. Patient was found moderate arthritic changes mostly of the medial joint space. Was given an injection last time. Patient was to do home exercises, topical anti-inflammatories and over-the-counter medications. Patient states that she is a proximally 40% better. Continues to have pain mostly going down the stairs. States that he still is on the medial aspect. No new symptoms. Some instability though noted. Feels though that sometimes she can lose her balance secondary to the discomfort as well as instability of this right knee. No significant radiation noted.      Past Medical History  Diagnosis Date  . Hypertension   . Hyperlipidemia    No past surgical history on file. Social History   Social History  . Marital Status: Married    Spouse Name: N/A  . Number of Children: N/A  . Years of Education: N/A   Social History Main Topics  . Smoking status: Former Games developer  . Smokeless tobacco: None  . Alcohol Use: None  . Drug Use: None  . Sexual Activity: Not Asked   Other Topics Concern  . None   Social History Narrative   Allergies  Allergen Reactions  . Septra [Sulfamethoxazole-Trimethoprim] Rash   No family history on file.  No family history of rheumatoid arthritis Past medical history, social, surgical and family history all reviewed in electronic medical record.  No pertanent information unless stated regarding to the chief complaint.   Review of Systems: No headache, visual changes, nausea, vomiting, diarrhea, constipation, dizziness, abdominal pain, skin rash, fevers, chills, night sweats, weight loss, swollen lymph nodes, body aches, joint swelling, muscle aches, chest pain, shortness of breath, mood changes.   Objective Blood  pressure 136/82, pulse 84, height  (1.6 m), weight 230 lb (104.327 kg), SpO2 97 %.  General: No apparent distress alert and oriented x3 mood and affect normal, dressed appropriately.  HEENT: Pupils equal, extraocular movements intact  Respiratory: Patient's speak in full sentences and does not appear short of breath  Cardiovascular: No lower extremity edema, non tender, no erythema  Skin: Warm dry intact with no signs of infection or rash on extremities or on axial skeleton.  Abdomen: Soft nontender  Neuro: Cranial nerves II through XII are intact, neurovascularly intact in all extremities with 2+ DTRs and 2+ pulses.  Lymph: No lymphadenopathy of posterior or anterior cervical chain or axillae bilaterally.  Gait normal with good balance and coordination.  MSK:  Non tender with full range of motion and good stability and symmetric strength and tone of shoulders, elbows, wrist, hip, and ankles bilaterally.  Knee: Right  Normal to inspection with no erythema or effusion or obvious bony abnormalities.  Tenderness is improved over the medial joint line but still has some pain over the pes anserine area ROM full in flexion and extension and lower leg rotation. Ligaments with solid consistent endpoints including ACL, PCL, LCL, MCL. Negative Mcmurray's, Apley's, and Thessalonian tests.  painful patellar compression with lateral tracking noted Patellar glide with moderate crepitus. Patellar and quadriceps tendons unremarkable. Hamstring and quadriceps strength is normal.  Contralateral knee has lateral tracking of the patella as well as but nontender on exam.   after verbal consent patient was prepped with call swabs and with a 25-gauge 1 inch needle  patient was injected with a total of 0.5 mL of 0.5% Marcaine and 0.5 mL of Kenalog 40 mg/dL in the pes anserine area. Tolerated procedure well with no blood loss.   Estimated body mass index is 40.75 kg/(m^2) as calculated from the following:    Height as of this encounter:  (1.6 m).   Weight as of this encounter: 230 lb (104.327 kg).      Impression and Recommendations:     This case required medical decision making of moderate complexity.      Note: This dictation was prepared with Dragon dictation along with smaller phrase technology. Any transcriptional errors that result from this process are unintentional.

## 2015-06-04 NOTE — Telephone Encounter (Signed)
Med refill approved 

## 2015-06-04 NOTE — Assessment & Plan Note (Signed)
Injected today. Some resolution of pain. I'm hoping that this is giving her some difficulty again. We discussed icing regimen. We discussed home exercises focusing on hamstring strengthening and eccentric. Patient and will come back and see me again in 3 weeks. Sent to formal physical therapy.

## 2015-06-04 NOTE — Progress Notes (Signed)
Pre visit review using our clinic review tool, if applicable. No additional management support is needed unless otherwise documented below in the visit note. 

## 2015-06-04 NOTE — Assessment & Plan Note (Signed)
To believe that this is patient's primary problem. Patient will be sent to formal physical therapy. Due to patient's 52 Ratio custom bracing noted for patient's instability. Patient will continue with the home exercises. Will come back and see me again in 3 weeks. Patient could be a candidate for viscous supplementation.  She willbe checking with her insurance.

## 2015-06-05 ENCOUNTER — Ambulatory Visit: Payer: Self-pay | Admitting: Physician Assistant

## 2015-06-14 ENCOUNTER — Telehealth: Payer: Self-pay | Admitting: Family Medicine

## 2015-06-14 NOTE — Telephone Encounter (Signed)
Patient found out insurance will cover an ortho disc but needs our office to call for the insurance company to get additional information.  Patient did not ask about mono disc.  Would like to know which one Dr. Katrinka Blazing suggest for her?

## 2015-06-14 NOTE — Telephone Encounter (Signed)
Discussed with pt, scheduled her to start series of injections.

## 2015-06-22 ENCOUNTER — Emergency Department: Payer: Managed Care, Other (non HMO)

## 2015-06-22 ENCOUNTER — Ambulatory Visit: Payer: Self-pay | Admitting: Family Medicine

## 2015-06-22 ENCOUNTER — Emergency Department
Admission: EM | Admit: 2015-06-22 | Discharge: 2015-06-22 | Disposition: A | Discharge status: Home / Self Care | Payer: Managed Care, Other (non HMO) | Attending: Student | Admitting: Student

## 2015-06-22 ENCOUNTER — Encounter: Payer: Self-pay | Admitting: Radiology

## 2015-06-22 DIAGNOSIS — Z87891 Personal history of nicotine dependence: Secondary | ICD-10-CM | POA: Diagnosis not present

## 2015-06-22 DIAGNOSIS — Z792 Long term (current) use of antibiotics: Secondary | ICD-10-CM | POA: Diagnosis not present

## 2015-06-22 DIAGNOSIS — K5732 Diverticulitis of large intestine without perforation or abscess without bleeding: Secondary | ICD-10-CM | POA: Diagnosis not present

## 2015-06-22 DIAGNOSIS — Z79899 Other long term (current) drug therapy: Secondary | ICD-10-CM | POA: Diagnosis not present

## 2015-06-22 DIAGNOSIS — R1032 Left lower quadrant pain: Secondary | ICD-10-CM | POA: Diagnosis present

## 2015-06-22 DIAGNOSIS — Z791 Long term (current) use of non-steroidal anti-inflammatories (NSAID): Secondary | ICD-10-CM | POA: Diagnosis not present

## 2015-06-22 DIAGNOSIS — I10 Essential (primary) hypertension: Secondary | ICD-10-CM | POA: Insufficient documentation

## 2015-06-22 LAB — CBC
HEMATOCRIT: 38 % (ref 35.0–47.0)
Hemoglobin: 12.9 g/dL (ref 12.0–16.0)
MCH: 27.8 pg (ref 26.0–34.0)
MCHC: 33.8 g/dL (ref 32.0–36.0)
MCV: 82.2 fL (ref 80.0–100.0)
PLATELETS: 240 10*3/uL (ref 150–440)
RBC: 4.63 MIL/uL (ref 3.80–5.20)
RDW: 13.6 % (ref 11.5–14.5)
WBC: 12.9 10*3/uL — ABNORMAL HIGH (ref 3.6–11.0)

## 2015-06-22 LAB — URINALYSIS COMPLETE WITH MICROSCOPIC (ARMC ONLY)
BILIRUBIN URINE: NEGATIVE
Bacteria, UA: NONE SEEN
GLUCOSE, UA: NEGATIVE mg/dL
Ketones, ur: NEGATIVE mg/dL
Leukocytes, UA: NEGATIVE
NITRITE: NEGATIVE
Protein, ur: NEGATIVE mg/dL
SPECIFIC GRAVITY, URINE: 1.035 — AB (ref 1.005–1.030)
pH: 6 (ref 5.0–8.0)

## 2015-06-22 LAB — COMPREHENSIVE METABOLIC PANEL
ALK PHOS: 82 U/L (ref 38–126)
ALT: 21 U/L (ref 14–54)
ANION GAP: 11 (ref 5–15)
AST: 17 U/L (ref 15–41)
Albumin: 4.2 g/dL (ref 3.5–5.0)
BILIRUBIN TOTAL: 1 mg/dL (ref 0.3–1.2)
BUN: 12 mg/dL (ref 6–20)
CALCIUM: 9.2 mg/dL (ref 8.9–10.3)
CO2: 25 mmol/L (ref 22–32)
Chloride: 104 mmol/L (ref 101–111)
Creatinine, Ser: 0.6 mg/dL (ref 0.44–1.00)
Glucose, Bld: 151 mg/dL — ABNORMAL HIGH (ref 65–99)
Potassium: 4 mmol/L (ref 3.5–5.1)
Sodium: 140 mmol/L (ref 135–145)
TOTAL PROTEIN: 7.1 g/dL (ref 6.5–8.1)

## 2015-06-22 LAB — LIPASE, BLOOD: Lipase: 27 U/L (ref 11–51)

## 2015-06-22 MED ORDER — METRONIDAZOLE 500 MG PO TABS: 500.0000 mg | ORAL_TABLET | Freq: Three times a day (TID) | ORAL | Status: DC

## 2015-06-22 MED ORDER — OXYCODONE HCL 5 MG PO TABS
10.0000 mg | ORAL_TABLET | Freq: Once | ORAL | Status: AC
Start: 2015-06-22 — End: 2015-06-22
  Administered 2015-06-22: 10 mg via ORAL
  Filled 2015-06-22: qty 2

## 2015-06-22 MED ORDER — MORPHINE SULFATE (PF) 4 MG/ML IV SOLN
INTRAVENOUS | Status: AC
  Administered 2015-06-22: 4 mg via INTRAVENOUS
  Filled 2015-06-22: qty 1

## 2015-06-22 MED ORDER — IOHEXOL 300 MG/ML  SOLN
100.0000 mL | Freq: Once | INTRAMUSCULAR | Status: AC | PRN
  Administered 2015-06-22: 100 mL via INTRAVENOUS

## 2015-06-22 MED ORDER — OXYCODONE HCL 5 MG PO TABS: 5.0000 mg | ORAL_TABLET | Freq: Four times a day (QID) | ORAL | Status: DC | PRN

## 2015-06-22 MED ORDER — MORPHINE SULFATE (PF) 4 MG/ML IV SOLN
4.0000 mg | Freq: Once | INTRAVENOUS | Status: AC
  Administered 2015-06-22: 4 mg via INTRAVENOUS

## 2015-06-22 MED ORDER — ONDANSETRON HCL 4 MG/2ML IJ SOLN
4.0000 mg | Freq: Once | INTRAMUSCULAR | Status: AC
  Administered 2015-06-22: 4 mg via INTRAVENOUS

## 2015-06-22 MED ORDER — ONDANSETRON HCL 4 MG/2ML IJ SOLN
INTRAMUSCULAR | Status: AC
Start: 2015-06-22 — End: 2015-06-22
  Administered 2015-06-22: 4 mg via INTRAVENOUS
  Filled 2015-06-22: qty 2

## 2015-06-22 MED ORDER — IOHEXOL 240 MG/ML SOLN
25.0000 mL | Freq: Once | INTRAMUSCULAR | Status: AC | PRN
  Administered 2015-06-22: 25 mL via ORAL

## 2015-06-22 MED ORDER — AMOXICILLIN-POT CLAVULANATE 875-125 MG PO TABS: 1.0000 | ORAL_TABLET | Freq: Two times a day (BID) | ORAL | Status: AC

## 2015-06-22 NOTE — ED Notes (Signed)
Patient to ED for LLQ abdominal pain, which started yesterday. Denies N/V/D. States she has a history of diverticulitis and this feels the same as when she has a flare. Started hurting yesterday and progressively got worse.

## 2015-06-22 NOTE — ED Notes (Signed)
Patient with significant pain and is about to drink oreDEID_[id number]U$4mgtered and carried by this RN.

## 2015-06-22 NOTE — ED Notes (Signed)
MD made aware of arrival and c/o. MD aware of patient's h/o diverticulitis and her feeling like this is a "flare". RN asked for order for CT abd/pelvis - MD with VORB to enter order for scan.

## 2015-06-22 NOTE — ED Notes (Signed)
Patient off the floor in CT.

## 2015-06-22 NOTE — ED Notes (Signed)
Patient to CT at this time

## 2015-06-22 NOTE — ED Provider Notes (Signed)
[facility] Emergency Department Provider Note  ____________________________________________  Time seen: Approximately 7:12 AM  I have reviewed the triage vital signs and the nursing notes.   HISTORY  Chief Complaint Abdominal Pain    HPI Diane Stephenson is a 55 y.o. female with HTN and hyperlipidemia as well as history of diverticulitis who presents for evaluation of left lower quadrant abdominal pain which has been severe since yesterday, gradual onset, constant since onset, worse with eating. No nausea, vomiting or diarrhea. No blood in stool. No fevers. No chest pain or difficulty breathing. She reports that feels similar to her usual diverticulitis flares. She reports that she has had some left-sided pain over the past week but it became much more severe yesterday.   Past Medical History  Diagnosis Date  . Hypertension   . Hyperlipidemia     Patient Active Problem List   Diagnosis Date Noted  . Arthritis of right knee 05/14/2015  . Pes anserine bursitis 05/14/2015  . Patellofemoral pain syndrome 05/14/2015  . Allergic rhinitis 03/27/2015  . Duodenitis 03/27/2015  . Can't get food down 03/27/2015  . Atypical chest pain 03/06/2015  . Family history of colonic polyps 12/28/2013    No past surgical history on file.  Current Outpatient Rx  Name  Route  Sig  Dispense  Refill  . cefdinir (OMNICEF) 300 MG capsule   Oral   Take 1 capsule (300 mg total) by mouth 2 (two) times daily.   20 capsule   0   . Cholecalciferol (VITAMIN D) 2000 UNITS tablet   Oral   Take by mouth.         . Diclofenac Sodium 2 % SOLN      Apply twice daily to affected area   1 Bottle   2   . lisinopril-hydrochlorothiazide (PRINZIDE,ZESTORETIC) 10-12.5 MG tablet   Oral   Take 1 tablet by mouth daily.   30 tablet   6   . loratadine (CLARITIN) 10 MG tablet   Oral   Take by mouth.         . Multiple Vitamin (MULTI-VITAMINS) TABS   Oral   Take by mouth.         Marland Kitchen omeprazole (PRILOSEC) 20 MG capsule   Oral   Take by mouth.         . simvastatin (ZOCOR) 10 MG tablet   Oral   Take 10 mg by mouth daily.           Allergies Septra  No family history on file.  Social History Social History  Substance Use Topics  . Smoking status: Former Games developer  . Smokeless tobacco: None  . Alcohol Use: None    Review of Systems Constitutional: No fever/chills Eyes: No visual changes. ENT: No sore throat. Cardiovascular: Denies chest pain. Respiratory: Denies shortness of breath. Gastrointestinal: + abdominal pain.  No nausea, no vomiting.  No diarrhea.  No constipation. Genitourinary: Negative for dysuria. Musculoskeletal: Negative for back pain. Skin: Negative for rash. Neurological: Negative for headaches, focal weakness or numbness.  10-point ROS otherwise negative.  ____________________________________________   PHYSICAL EXAM:  VITAL SIGNS: ED Triage Vitals  Enc Vitals Group     BP 06/22/15 0532 141/106 mmHg     Pulse Rate 06/22/15 0532 99     Resp 06/22/15 0532 20     Temp 06/22/15 0532 98.4 F (36.9 C)     Temp Source 06/22/15 0532 Oral     SpO2 06/22/15 0532 98 %  Weight 06/22/15 0532 230 lb (104.327 kg)     Height 06/22/15 0532  (1.6 m)     Head Cir --      Peak Flow --      Pain Score 06/22/15 0533 10     Pain Loc --      Pain Edu? --      Excl. in GC? --     Constitutional: Alert and oriented. In mild intermittent distress due to pain. Eyes: Conjunctivae are normal. PERRL. EOMI. Head: Atraumatic. Nose: No congestion/rhinnorhea. Mouth/Throat: Mucous membranes are moist.  Oropharynx non-erythematous. Neck: No stridor.   Cardiovascular: Normal rate, regular rhythm. Grossly normal heart sounds.  Good peripheral circulation. Respiratory: Normal respiratory effort.  No retractions. Lungs CTAB. Gastrointestinal: Soft with moderate tenderness to palpation throat the left abdomen. Normal bowel sounds. No  CVA tenderness. Genitourinary: deferred Musculoskeletal: No lower extremity tenderness nor edema.  No joint effusions. Neurologic:  Normal speech and language. No gross focal neurologic deficits are appreciated. No gait instability. Skin:  Skin is warm, dry and intact. No rash noted. Psychiatric: Mood and affect are normal. Speech and behavior are normal.  ____________________________________________   LABS (all labs ordered are listed, but only abnormal results are displayed)  Labs Reviewed  COMPREHENSIVE METABOLIC PANEL - Abnormal; Notable for the following:    Glucose, Bld 151 (*)    All other components within normal limits  CBC - Abnormal; Notable for the following:    WBC 12.9 (*)    All other components within normal limits  LIPASE, BLOOD  URINALYSIS COMPLETEWITH MICROSCOPIC ([facility] ONLY)   ____________________________________________  EKG  none ____________________________________________  RADIOLOGY  CT abdomen and pelvis IMPRESSION: 1. Distal descending diverticulitis without abscess. 2. 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 3. Small right adrenal nodule, statistically most likely benign adenoma in the absence of a history of primary carcinoma.  ____________________________________________   PROCEDURES  Procedure(s) performed: None  Critical Care performed: No  ____________________________________________   INITIAL IMPRESSION / ASSESSMENT AND PLAN / ED COURSE  Pertinent labs & imaging results that were available during my care of the patient were reviewed by me and considered in my medical decision making (see chart for details).  Diane Stephenson is a 55 y.o. female with HTN and hyperlipidemia as well as history of diverticulitis who presents for evaluation of left lower quadrant abdominal pain. On exam, she is intermittently in distress due to pain. Vital signs stable, she is afebrile. She does  have significant tenderness throughout the left abdomen. Labs reviewed are notable for leukocytosis with white blood cell count 12.9. Lipase and CMP unremarkable. Awaiting CT of the abdomen and pelvis.  ----------------------------------------- 9:13 AM on 06/22/2015 -----------------------------------------  CT of the abdomen and pelvis confirms uncomplicated diverticulitis. Pain significantly improved at this time. Patient appears much more comfortable at this time. We'll discharge with Flagyl and Augmentin for treatment of uncomplicated diverticulitis. We discussed return precautions, need for close PCP follow-up and she is comfortable with the discharge plan. DC home. ____________________________________________   FINAL CLINICAL IMPRESSION(S) / ED DIAGNOSES  Final diagnoses:  Diverticulitis of large intestine without perforation or abscess without bleeding      Gayla Doss, MD 06/22/15 [phone]

## 2015-06-22 NOTE — ED Notes (Signed)
Patient continues to appear to be uncomfortable; rates pain 6/10. MD made aware; VORB for Morphine  IVP now. Order to be entered and carried by this RN.

## 2015-06-22 NOTE — ED Notes (Signed)
PO contrast volume to bedside. Patient premedicated with Zofran and Morphine for pain and nausea.. Patient to notify RN when she has completed volume/

## 2015-06-25 ENCOUNTER — Ambulatory Visit: Payer: Self-pay | Admitting: Family Medicine

## 2015-07-03 ENCOUNTER — Encounter: Payer: Self-pay | Admitting: Family Medicine

## 2015-07-03 ENCOUNTER — Ambulatory Visit (INDEPENDENT_AMBULATORY_CARE_PROVIDER_SITE_OTHER): Payer: Managed Care, Other (non HMO) | Admitting: Family Medicine

## 2015-07-03 VITALS — BP 108/82 | HR 82 | Ht 63.0 in | Wt 230.0 lb

## 2015-07-03 DIAGNOSIS — M129 Arthropathy, unspecified: Secondary | ICD-10-CM | POA: Diagnosis not present

## 2015-07-03 DIAGNOSIS — M1711 Unilateral primary osteoarthritis, right knee: Secondary | ICD-10-CM

## 2015-07-03 NOTE — Assessment & Plan Note (Signed)
Patient was given an injection of viscous supple mentation today. We discussed icing regimen, home exercise, which activities to do a which was to avoid. Patient continue with the brace. Patient continue with formal physical therapy. Patient follow-up in 4 weeks for further evaluation and treatment.

## 2015-07-03 NOTE — Progress Notes (Signed)
Pre visit review using our clinic review tool, if applicable. No additional management support is needed unless otherwise documented below in the visit note. 

## 2015-07-03 NOTE — Progress Notes (Signed)
Diane Stephenson [address] [place], [place] [address] Phone: Diane Stephenson Subjective:    CC: Right knee pain f/u  Diane Stephenson is a 55 y.o. female coming in with complaint of knee pain.  Right-sided. Patient was found moderate arthritic changes mostly of the medial joint space. Patient is been given 2 different corticosteroid injections in the knee previously. Patient was to do home exercises, icing, as well as we discussed formal physical therapy. Patient states everything made some mild improvement but continues to have difficult even with regular daily activities. Sometimes has feelings of instability. Patient is wearing the custom brace on a regular basis which has helped. Patient was also seen and was given a pes anserine injection that did not make any significant improvement.     Past Medical History  Diagnosis Date  . Hypertension   . Hyperlipidemia    No past surgical history on file. Social History   Social History  . Marital Status: Married    Spouse Name: N/A  . Number of Children: N/A  . Years of Education: N/A   Social History Main Topics  . Smoking status: Former Games developer  . Smokeless tobacco: None  . Alcohol Use: None  . Drug Use: None  . Sexual Activity: Not Asked   Other Topics Concern  . None   Social History Narrative   Allergies  Allergen Reactions  . Septra [Sulfamethoxazole-Trimethoprim] Rash   No family history on file.  No family history of rheumatoid arthritis Past medical history, social, surgical and family history all reviewed in electronic medical record.  No pertanent information unless stated regarding to the chief complaint.   Review of Systems: No headache, visual changes, nausea, vomiting, diarrhea, constipation, dizziness, abdominal pain, skin rash, fevers, chills, night sweats, weight loss, swollen lymph nodes, body aches, joint swelling, muscle aches, chest pain, shortness of breath, mood  changes.   Objective Blood pressure 108/82, pulse 82, height  (1.6 m), weight 230 lb (104.327 kg), SpO2 97 %.  General: No apparent distress alert and oriented x3 mood and affect normal, dressed appropriately.  HEENT: Pupils equal, extraocular movements intact  Respiratory: Patient's speak in full sentences and does not appear short of breath  Cardiovascular: No lower extremity edema, non tender, no erythema  Skin: Warm dry intact with no signs of infection or rash on extremities or on axial skeleton.  Abdomen: Soft nontender  Neuro: Cranial nerves II through XII are intact, neurovascularly intact in all extremities with 2+ DTRs and 2+ pulses.  Lymph: No lymphadenopathy of posterior or anterior cervical chain or axillae bilaterally.  Gait normal with good balance and coordination.  MSK:  Non tender with full range of motion and good stability and symmetric strength and tone of shoulders, elbows, wrist, hip, and ankles bilaterally.  Knee: Right  Normal to inspection with no erythema or effusion or obvious bony abnormalities.  Tenderness is improved over the medial joint line  ROM full in flexion and extension and lower leg rotation. Ligaments with solid consistent endpoints including ACL, PCL, LCL, MCL. Negative Mcmurray's, Apley's, and Thessalonian tests.  painful patellar compression with lateral tracking noted Patellar glide with moderate crepitus. Patellar and quadriceps tendons unremarkable. Hamstring and quadriceps strength is normal.  Contralateral knee unremarkable   after verbal consent patient was prepped with call swabs and with a 25-gauge 1 inch needle patient was injected with 22 mg/ mL of Monovisc (sodium hyaluronate) in a prefilled syringe was injected easily  into the knee through a 22-gauge needle.. Tolerated procedure well with no blood loss.   Estimated body mass index is 40.75 kg/(m^2) as calculated from the following:   Height as of this encounter: 5\' 3"  (1.6  m).   Weight as of this encounter: 230 lb (104.327 kg).      Impression and Recommendations:     This case required medical decision making of moderate complexity.      Note: This dictation was prepared with Dragon dictation along with smaller phrase technology. Any transcriptional errors that result from this process are unintentional.

## 2015-07-03 NOTE — Patient Instructions (Signed)
Good to see you.  Ice is your friend especially in 6 hours Continue with PT I hope this helps a lot See me again in 4 weeks if pain is coming back otherwise see you when you need me.

## 2015-07-06 ENCOUNTER — Encounter: Payer: Self-pay | Admitting: Physician Assistant

## 2015-07-06 ENCOUNTER — Ambulatory Visit: Payer: Self-pay | Admitting: Physician Assistant

## 2015-07-06 VITALS — BP 154/88 | HR 80 | Temp 98.3°F

## 2015-07-06 DIAGNOSIS — K5732 Diverticulitis of large intestine without perforation or abscess without bleeding: Secondary | ICD-10-CM

## 2015-07-06 DIAGNOSIS — N951 Menopausal and female climacteric states: Secondary | ICD-10-CM

## 2015-07-06 MED ORDER — VENLAFAXINE HCL 75 MG PO TABS: 75.0000 mg | ORAL_TABLET | Freq: Two times a day (BID) | ORAL | Status: DC

## 2015-07-06 MED ORDER — OMEPRAZOLE 20 MG PO CPDR: 20.0000 mg | DELAYED_RELEASE_CAPSULE | Freq: Every day | ORAL | Status: DC

## 2015-07-06 NOTE — Progress Notes (Signed)
S: here for recheck of diverticulitis, was seen in the ER, given augmentin and flagyll , states pain has gone away but had to stop flagyll bc it was causing her pain and other side effects, still has diarrhea and doesn't know if she needs another round of antibiotics, also stressed at work, having a hard time due to job changes, also having hot flashes  O: vitals with slightly elevated bp, lungs c t a, cv rrr, abd soft nontender bs normal, r knee is in brace, n/v intact  A: diverticulitis, hot flashes, anxiety  P: otc probiotic, recommend align, effexor 75mg  qd may increase to bid if needed, will refer to endocrinology due to adenoma on adrenal gland seen on CT

## 2015-07-06 NOTE — Patient Instructions (Signed)
Diverticulosis Diverticulosis is the condition that develops when small pouches (diverticula) form in the wall of your colon. Your colon, or large intestine, is where water is absorbed and stool is formed. The pouches form when the inside layer of your colon pushes through weak spots in the outer layers of your colon. CAUSES  No one knows exactly what causes diverticulosis. RISK FACTORS  Being older than 50. Your risk for this condition increases with age. Diverticulosis is rare in people younger than 40 years. By age 20, almost everyone has it.  Eating a low-fiber diet.  Being frequently constipated.  Being overweight.  Not getting enough exercise.  Smoking.  Taking over-the-counter pain medicines, like aspirin and ibuprofen. SYMPTOMS  Most people with diverticulosis do not have symptoms. DIAGNOSIS  Because diverticulosis often has no symptoms, health care providers often discover the condition during an exam for other colon problems. In many cases, a health care provider will diagnose diverticulosis while using a flexible scope to examine the colon (colonoscopy). TREATMENT  If you have never developed an infection related to diverticulosis, you may not need treatment. If you have had an infection before, treatment may include:  Eating more fruits, vegetables, and grains.  Taking a fiber supplement.  Taking a live bacteria supplement (probiotic).  Taking medicine to relax your colon. HOME CARE INSTRUCTIONS   Drink at least 6-8 glasses of water each day to prevent constipation.  Try not to strain when you have a bowel movement.  Keep all follow-up appointments. If you have had an infection before:  Increase the fiber in your diet as directed by your health care provider or dietitian.  Take a dietary fiber supplement if your health care provider approves.  Only take medicines as directed by your health care provider. SEEK MEDICAL CARE IF:   You have abdominal  pain.  You have bloating.  You have cramps.  You have not gone to the bathroom in 3 days. SEEK IMMEDIATE MEDICAL CARE IF:   Your pain gets worse.  Yourbloating becomes very bad.  You have a fever or chills, and your symptoms suddenly get worse.  You begin vomiting.  You have bowel movements that are bloody or black. MAKE SURE YOU:  Understand these instructions.  Will watch your condition.  Will get help right away if you are not doing well or get worse.   This information is not intended to replace advice given to you by your health care provider. Make sure you discuss any questions you have with your health care provider.   Document Released: 01/10/2004 Document Revised: 04/19/2013 Document Reviewed: 03/09/2013 Elsevier Interactive Patient Education 2016 Elsevier Inc. Diarrhea Diarrhea is watery poop (stool). It can make you feel weak, tired, thirsty, or give you a dry mouth (signs of dehydration). Watery poop is a sign of another problem, most often an infection. It often lasts 2-3 days. It can last longer if it is a sign of something serious. Take care of yourself as told by your doctor. HOME CARE   Drink 1 cup (8 ounces) of fluid each time you have watery poop.  Do not drink the following fluids:  Those that contain simple sugars (fructose, glucose, galactose, lactose, sucrose, maltose).  Sports drinks.  Fruit juices.  Whole milk products.  Sodas.  Drinks with caffeine (coffee, tea, soda) or alcohol.  Oral rehydration solution may be used if the doctor says it is okay. You may make your own solution. Follow this recipe:   - teaspoon table  salt.   teaspoon baking soda.   teaspoon salt substitute containing potassium chloride.  1 tablespoons sugar.  1 liter (34 ounces) of water.  Avoid the following foods:  High fiber foods, such as raw fruits and vegetables.  Nuts, seeds, and whole grain breads and cereals.   Those that are sweetened with  sugar alcohols (xylitol, sorbitol, mannitol).  Try eating the following foods:  Starchy foods, such as rice, toast, pasta, low-sugar cereal, oatmeal, baked potatoes, crackers, and bagels.  Bananas.  Applesauce.  Eat probiotic-rich foods, such as yogurt and milk products that are fermented.  Wash your hands well after each time you have watery poop.  Only take medicine as told by your doctor.  Take a warm bath to help lessen burning or pain from having watery poop. GET HELP RIGHT AWAY IF:   You cannot drink fluids without throwing up (vomiting).  You keep throwing up.  You have blood in your poop, or your poop looks black and tarry.  You do not pee (urinate) in 6-8 hours, or there is only a small amount of very dark pee.  You have belly (abdominal) pain that gets worse or stays in the same spot (localizes).  You are weak, dizzy, confused, or light-headed.  You have a very bad headache.  Your watery poop gets worse or does not get better.  You have a fever or lasting symptoms for more than 2-3 days.  You have a fever and your symptoms suddenly get worse. MAKE SURE YOU:   Understand these instructions.  Will watch your condition.  Will get help right away if you are not doing well or get worse.   This information is not intended to replace advice given to you by your health care provider. Make sure you discuss any questions you have with your health care provider.   Document Released: 10/01/2007 Document Revised: 05/05/2014 Document Reviewed: 12/21/2011 Elsevier Interactive Patient Education [employer] Elsevier Inc.

## 2015-07-31 ENCOUNTER — Ambulatory Visit (INDEPENDENT_AMBULATORY_CARE_PROVIDER_SITE_OTHER): Payer: Managed Care, Other (non HMO) | Admitting: Family Medicine

## 2015-07-31 ENCOUNTER — Encounter: Payer: Self-pay | Admitting: Family Medicine

## 2015-07-31 VITALS — BP 130/80 | HR 92 | Ht 63.0 in | Wt 229.0 lb

## 2015-07-31 DIAGNOSIS — M222X1 Patellofemoral disorders, right knee: Secondary | ICD-10-CM

## 2015-07-31 DIAGNOSIS — M129 Arthropathy, unspecified: Secondary | ICD-10-CM

## 2015-07-31 DIAGNOSIS — M1711 Unilateral primary osteoarthritis, right knee: Secondary | ICD-10-CM

## 2015-07-31 NOTE — Assessment & Plan Note (Signed)
I do believe the patient does have some arthritis of the right knee but I would put it is more to the mild to moderate in severity. X-rays and MRI pending as this. Patient though is now having more internal derangement of the knee which makes me concerned for a possible meniscal injury. Seems that ligament seems to be intact. Patient could be also having some mild subluxation of the kneecap. Discussed with patient again and gave her different options. I do feel that with her failing everything else advance imaging is warranted with an MRI. Depending on findings of the amount of arthritic change we can discuss different treatment options including even surgical intervention.mmm  Spent  25 minutes with patient face-to-face and had greater than 50% of counseling including as described above in assessment and plan.

## 2015-07-31 NOTE — Progress Notes (Signed)
Pre visit review using our clinic review tool, if applicable. No additional management support is needed unless otherwise documented below in the visit note. 

## 2015-07-31 NOTE — Progress Notes (Signed)
[name] [address] [place], [place] [address] Phone: [phone] Subjective:    CC: Right knee pain f/u  [id number] Diane Stephenson is a 55 y.o. female coming in with complaint of knee pain.  Right-sided. Patient was found moderate arthritic changes mostly of the medial joint space. Patient and failed all other conservative therapy including bracing and injections. Patient did have viscous supplementation one month ago. Patient continued to do the other conservative therapy. Patient unfortunately is having more instability of the knee. States that the pain seems to be better but unfortunately feels like the knee is going to give out on her unless she wears her brace. States that it seems to be worsening where she can't walk more than a quarter of a mile without significant amount of pain. Does not notice any swelling. States though that the instability is making her concern.     Past Medical History  Diagnosis Date  . Hypertension   . Hyperlipidemia    No past surgical history on file. Social History   Social History  . Marital Status: Married    Spouse Name: N/A  . Number of Children: N/A  . Years of Education: N/A   Social History Main Topics  . Smoking status: Former Games developer  . Smokeless tobacco: Not on file  . Alcohol Use: Not on file  . Drug Use: Not on file  . Sexual Activity: Not on file   Other Topics Concern  . Not on file   Social History Narrative   Allergies  Allergen Reactions  . Septra [Sulfamethoxazole-Trimethoprim] Rash   No family history on file.  No family history of rheumatoid arthritis Past medical history, social, surgical and family history all reviewed in electronic medical record.  No pertanent information unless stated regarding to the chief complaint.   Review of Systems: No headache, visual changes, nausea, vomiting, diarrhea, constipation, dizziness, abdominal pain, skin rash, fevers, chills, night  sweats, weight loss, swollen lymph nodes, body aches, joint swelling, muscle aches, chest pain, shortness of breath, mood changes.   Objective There were no vitals taken for this visit.  General: No apparent distress alert and oriented x3 mood and affect normal, dressed appropriately.  HEENT: Pupils equal, extraocular movements intact  Respiratory: Patient's speak in full sentences and does not appear short of breath  Cardiovascular: No lower extremity edema, non tender, no erythema  Skin: Warm dry intact with no signs of infection or rash on extremities or on axial skeleton.  Abdomen: Soft nontender  Neuro: Cranial nerves II through XII are intact, neurovascularly intact in all extremities with 2+ DTRs and 2+ pulses.  Lymph: No lymphadenopathy of posterior or anterior cervical chain or axillae bilaterally.  Gait normal with good balance and coordination.  MSK:  Non tender with full range of motion and good stability and symmetric strength and tone of shoulders, elbows, wrist, hip, and ankles bilaterally.  Knee: Right  Normal to inspection with no erythema or effusion or obvious bony abnormalities.  Tenderness is improved over the medial joint line worse than previous exam ROM full in flexion and extension and lower leg rotation. Ligaments with solid consistent endpoints including ACL, PCL, LCL, MCL. Positive Mcmurray's, Apley's, and Thessalonian tests. Which is a new  painful patellar compression with lateral tracking noted Patellar glide with moderate crepitus. Patellar and quadriceps tendons unremarkable. Hamstring and quadriceps strength is normal.  Contralateral knee unremarkable     Estimated body mass index is 40.75 kg/(m^2)  as calculated from the following:   Height as of 07/03/15: 5\' 3"  (1.6 m).   Weight as of 07/03/15: 230 lb (104.327 kg).      Impression and Recommendations:     This case required medical decision making of moderate complexity.      Note: This  dictation was prepared with Dragon dictation along with smaller phrase technology. Any transcriptional errors that result from this process are unintentional.

## 2015-07-31 NOTE — Patient Instructions (Signed)
Good to see you  I am sorry you are still having trouble and the instability  Lets get the MRI and see what is going on.  I will release it in my chart and we will discuss options.  Keep up with the tylenol and vitamins

## 2015-08-03 ENCOUNTER — Other Ambulatory Visit: Payer: Self-pay | Admitting: Physician Assistant

## 2015-08-06 NOTE — Telephone Encounter (Signed)
Med refill approved 

## 2015-08-07 ENCOUNTER — Ambulatory Visit
Admission: RE | Admit: 2015-08-07 | Discharge: 2015-08-07 | Disposition: A | Discharge status: Home / Self Care | Payer: Managed Care, Other (non HMO) | Source: Ambulatory Visit | Attending: Family Medicine | Admitting: Family Medicine

## 2015-08-07 DIAGNOSIS — M1711 Unilateral primary osteoarthritis, right knee: Secondary | ICD-10-CM

## 2015-08-07 DIAGNOSIS — M222X1 Patellofemoral disorders, right knee: Secondary | ICD-10-CM

## 2015-08-13 ENCOUNTER — Encounter: Payer: Self-pay | Admitting: Family Medicine

## 2015-08-13 ENCOUNTER — Other Ambulatory Visit: Payer: Self-pay

## 2015-08-13 DIAGNOSIS — M25561 Pain in right knee: Secondary | ICD-10-CM

## 2015-08-17 NOTE — Progress Notes (Signed)
Patient ID: Diane Stephenson, female   DOB: 11-10-1960, 55 y.o.   MRN: [id number] Patient has been referred to see Dr. Durwin GlazeAbisongun [facility] on 08/28/15 at 11am. Patient has been notified.

## 2016-01-17 ENCOUNTER — Other Ambulatory Visit: Payer: Self-pay | Admitting: Physician Assistant

## 2016-02-19 ENCOUNTER — Other Ambulatory Visit: Payer: Self-pay | Admitting: Physician Assistant

## 2016-08-15 ENCOUNTER — Ambulatory Visit: Payer: Self-pay | Admitting: Physician Assistant

## 2016-08-15 ENCOUNTER — Encounter: Payer: Self-pay | Admitting: Physician Assistant

## 2016-08-15 VITALS — BP 140/80 | HR 98 | Temp 98.7°F

## 2016-08-15 DIAGNOSIS — K5792 Diverticulitis of intestine, part unspecified, without perforation or abscess without bleeding: Secondary | ICD-10-CM

## 2016-08-15 MED ORDER — CIPROFLOXACIN HCL 500 MG PO TABS: 500.0000 mg | ORAL_TABLET | Freq: Two times a day (BID) | ORAL | 0 refills | Status: DC

## 2016-08-15 NOTE — Progress Notes (Signed)
S: c/o llq pain, hx of diverticulitis, confirmed by colonoscopy a few years ago, some low grade fever, no v/d, did a liquid diet for 24 hours when started having pain, no blood in stool, remainder ros neg, pt states she can't tolerate flagyl  O: vitals wnl, nad, appears well, lungs c t a,c v rrr, abd soft tender in llq, bs normal all 4 quads  A: acute diverticulitis  P: cipro  bid, liquid diet for remainder of day, then go to bland diet, go to ER if worsening

## 2016-08-20 ENCOUNTER — Other Ambulatory Visit: Payer: Self-pay

## 2016-08-20 DIAGNOSIS — Z299 Encounter for prophylactic measures, unspecified: Secondary | ICD-10-CM

## 2016-08-20 NOTE — Progress Notes (Addendum)
Patient came in to have blood drawn for testing per Susan's authorization.  Pt's cholesterol is still high even with medication, will increase simvastatin from  to , recheck lipids in 6 months

## 2016-08-21 ENCOUNTER — Ambulatory Visit: Payer: Self-pay | Admitting: Physician Assistant

## 2016-08-21 ENCOUNTER — Encounter: Payer: Self-pay | Admitting: Physician Assistant

## 2016-08-21 VITALS — BP 130/80 | HR 78 | Temp 98.3°F | Ht 63.0 in | Wt 227.0 lb

## 2016-08-21 DIAGNOSIS — Z0189 Encounter for other specified special examinations: Secondary | ICD-10-CM

## 2016-08-21 DIAGNOSIS — Z008 Encounter for other general examination: Secondary | ICD-10-CM

## 2016-08-21 DIAGNOSIS — Z0000 Encounter for general adult medical examination without abnormal findings: Secondary | ICD-10-CM

## 2016-08-21 LAB — CMP12+LP+TP+TSH+6AC+CBC/D/PLT
ALT: 16 IU/L (ref 0–32)
AST: 18 IU/L (ref 0–40)
Albumin/Globulin Ratio: 1.6 (ref 1.2–2.2)
Albumin: 4.2 g/dL (ref 3.5–5.5)
Alkaline Phosphatase: 78 IU/L (ref 39–117)
BUN/Creatinine Ratio: 28 — ABNORMAL HIGH (ref 9–23)
BUN: 19 mg/dL (ref 6–24)
Basophils Absolute: 0.1 10*3/uL (ref 0.0–0.2)
Basos: 1 %
Bilirubin Total: 0.3 mg/dL (ref 0.0–1.2)
CALCIUM: 9.8 mg/dL (ref 8.7–10.2)
CHOLESTEROL TOTAL: 222 mg/dL — AB (ref 100–199)
Chloride: 101 mmol/L (ref 96–106)
Chol/HDL Ratio: 5.7 ratio — ABNORMAL HIGH (ref 0.0–4.4)
Creatinine, Ser: 0.68 mg/dL (ref 0.57–1.00)
EOS (ABSOLUTE): 0.4 10*3/uL (ref 0.0–0.4)
Eos: 6 %
Estimated CHD Risk: 1.6 times avg. — ABNORMAL HIGH (ref 0.0–1.0)
FREE THYROXINE INDEX: 1.9 (ref 1.2–4.9)
GFR calc Af Amer: 115 mL/min/{1.73_m2} (ref >59)
GFR calc non Af Amer: 99 mL/min/{1.73_m2} (ref >59)
GGT: 17 IU/L (ref 0–60)
Globulin, Total: 2.6 g/dL (ref 1.5–4.5)
Glucose: 123 mg/dL — ABNORMAL HIGH (ref 65–99)
HDL: 39 mg/dL — ABNORMAL LOW (ref >39)
Hematocrit: 37.1 % (ref 34.0–46.6)
Hemoglobin: 12 g/dL (ref 11.1–15.9)
IRON: 68 ug/dL (ref 27–159)
Immature Grans (Abs): 0 10*3/uL (ref 0.0–0.1)
Immature Granulocytes: 0 %
LDH: 183 IU/L (ref 119–226)
LDL Calculated: 160 mg/dL — ABNORMAL HIGH (ref 0–99)
LYMPHS: 31 %
Lymphocytes Absolute: 2.2 10*3/uL (ref 0.7–3.1)
MCH: 27.5 pg (ref 26.6–33.0)
MCHC: 32.3 g/dL (ref 31.5–35.7)
MCV: 85 fL (ref 79–97)
MONOS ABS: 0.3 10*3/uL (ref 0.1–0.9)
Monocytes: 4 %
NEUTROS ABS: 4.3 10*3/uL (ref 1.4–7.0)
NEUTROS PCT: 58 %
PHOSPHORUS: 3.7 mg/dL (ref 2.5–4.5)
PLATELETS: 288 10*3/uL (ref 150–379)
Potassium: 4.6 mmol/L (ref 3.5–5.2)
RBC: 4.36 x10E6/uL (ref 3.77–5.28)
RDW: 13.2 % (ref 12.3–15.4)
Sodium: 143 mmol/L (ref 134–144)
T3 UPTAKE RATIO: 30 % (ref 24–39)
T4 TOTAL: 6.3 ug/dL (ref 4.5–12.0)
TOTAL PROTEIN: 6.8 g/dL (ref 6.0–8.5)
TSH: 0.792 u[IU]/mL (ref 0.450–4.500)
Triglycerides: 117 mg/dL (ref 0–149)
URIC ACID: 5.7 mg/dL (ref 2.5–7.1)
VLDL Cholesterol Cal: 23 mg/dL (ref 5–40)
WBC: 7.3 10*3/uL (ref 3.4–10.8)

## 2016-08-21 LAB — HIV ANTIBODY (ROUTINE TESTING W REFLEX): HIV SCREEN 4TH GENERATION: NONREACTIVE

## 2016-08-21 LAB — HCV COMMENT:

## 2016-08-21 LAB — HEPATITIS C ANTIBODY (REFLEX): HCV Ab: <0.1 s/co ratio (ref 0.0–0.9)

## 2016-08-21 LAB — VITAMIN D 25 HYDROXY (VIT D DEFICIENCY, FRACTURES): Vit D, 25-Hydroxy: 26.3 ng/mL — ABNORMAL LOW (ref 30.0–100.0)

## 2016-08-21 MED ORDER — SIMVASTATIN 20 MG PO TABS: 20.0000 mg | ORAL_TABLET | Freq: Every day | ORAL | 3 refills | Status: DC

## 2016-08-21 NOTE — Addendum Note (Signed)
Addended by: Faythe Ghee on: 08/21/2016 08:48 AM   Modules accepted: Orders

## 2016-08-21 NOTE — Progress Notes (Signed)
130/80

## 2016-08-21 NOTE — Progress Notes (Signed)
S: pt here for wellness physical and biometrics for insurance purposes, no complaints ros neg. PMH:   htn, hyperlipidemia  Social: former smoker, no etoh or drugs Fam: thyroid disease, father had CABG x 3, brother has diabetes, 1/2 sister has breast CA  O: vitals wnl, nad, ENT wnl, neck supple no lymph, lungs c t a, cv rrr, abd soft nontender bs normal all 4 quads  A: wellness, biometric physical  P: diet and exercise, we are adding an A1C to labs, pt has not been taking her simvastatin due to joint pain, try otc fish oil and niacin, recheck prn, mammogram ordered

## 2016-08-23 ENCOUNTER — Other Ambulatory Visit: Payer: Self-pay | Admitting: Physician Assistant

## 2016-08-23 LAB — HGB A1C W/O EAG: Hgb A1c MFr Bld: 6.2 % — ABNORMAL HIGH (ref 4.8–5.6)

## 2016-08-23 LAB — SPECIMEN STATUS REPORT

## 2016-08-25 NOTE — Telephone Encounter (Signed)
Med refill for lisinopril/hctz approved, 90 d supply with 3 refills

## 2016-09-22 ENCOUNTER — Other Ambulatory Visit: Payer: Self-pay | Admitting: Physician Assistant

## 2016-09-23 NOTE — Telephone Encounter (Signed)
Med refill for omeprazole approved 

## 2016-11-18 ENCOUNTER — Other Ambulatory Visit: Payer: Self-pay | Admitting: Physician Assistant

## 2016-11-18 NOTE — Progress Notes (Signed)
Patient came in to have labs done per Dr. Lemar LoftyKlett's orders.  Patient was sent to [facility] to collection the urine jug to collect her urine in a 24 hour period.

## 2016-11-24 ENCOUNTER — Other Ambulatory Visit: Payer: Self-pay

## 2016-12-03 LAB — CATECHOLAMINES, FRACTIONATED, URINE, 24 HOUR
DOPAMINE 24H UR: 4 ug/(24.h) (ref 0–510)
Dopamine, Rand Ur: 69 ug/L
EPINEPHRINE 24H UR: 0 ug/(24.h) (ref 0–20)
Epinephrine, Rand Ur: <1 ug/L
NOREPINEPH RAND UR: 15 ug/L
NOREPINEPHRINE 24H UR: 1 ug/(24.h) (ref 0–135)

## 2016-12-03 LAB — METANEPHRINES, URINE, 24 HOUR
METANEPHRINES 24H UR: 2 ug/(24.h) — AB (ref 45–290)
Metaneph Total, Ur: 35 ug/L
Normetanephrine, 24H Ur: 7 ug/24 hr — ABNORMAL LOW (ref 82–500)
Normetanephrine, Ur: 142 ug/L

## 2016-12-03 LAB — CREATININE, URINE, 24 HOUR
CREATININE, UR: 58 mg/dL
Creatinine, 24H Ur: 30 mg/24 hr — ABNORMAL LOW (ref 800–1800)

## 2016-12-03 LAB — CORTISOL, URINE, FREE
CORTISOL (UR), FREE: 0 ug/(24.h) (ref 0–50)
CORTISOL,F,UG/L,U: 6 ug/L

## 2017-01-25 IMAGING — DX DG KNEE COMPLETE 4+V*R*
4 series · 4 of 4 positions shown · non-contrast
Comparison: None.

CLINICAL DATA: Right knee pain and swelling for 2 weeks without
known injury.

EXAM:
RIGHT KNEE - COMPLETE 4+ VIEW

[knee ap]
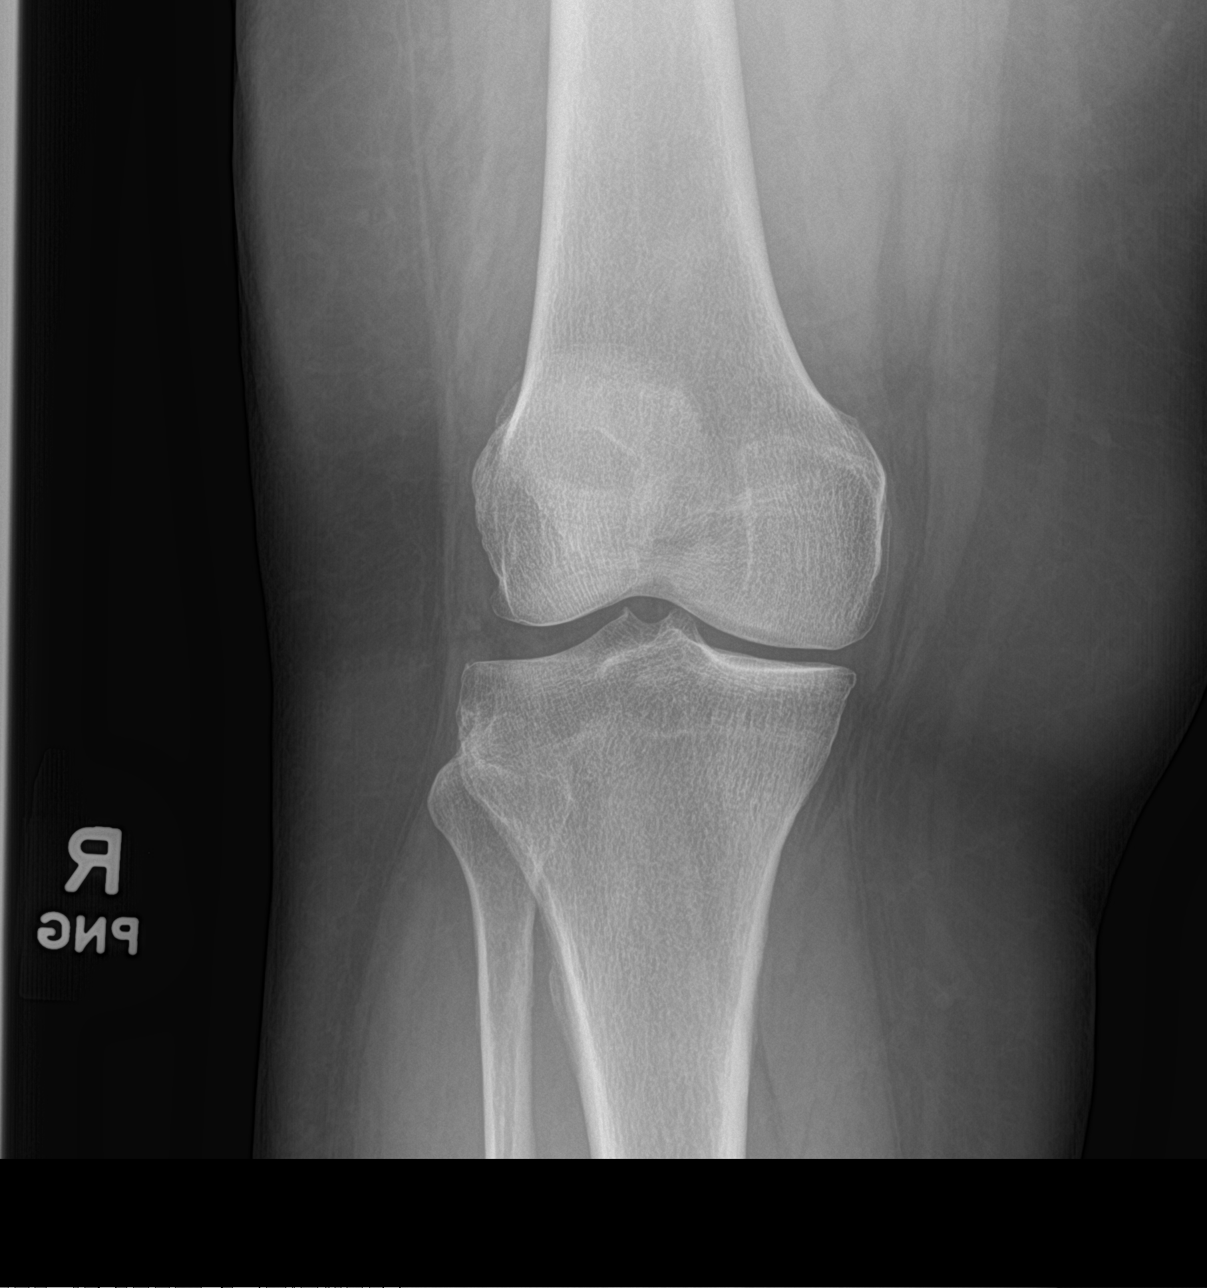

[knee tunnel]
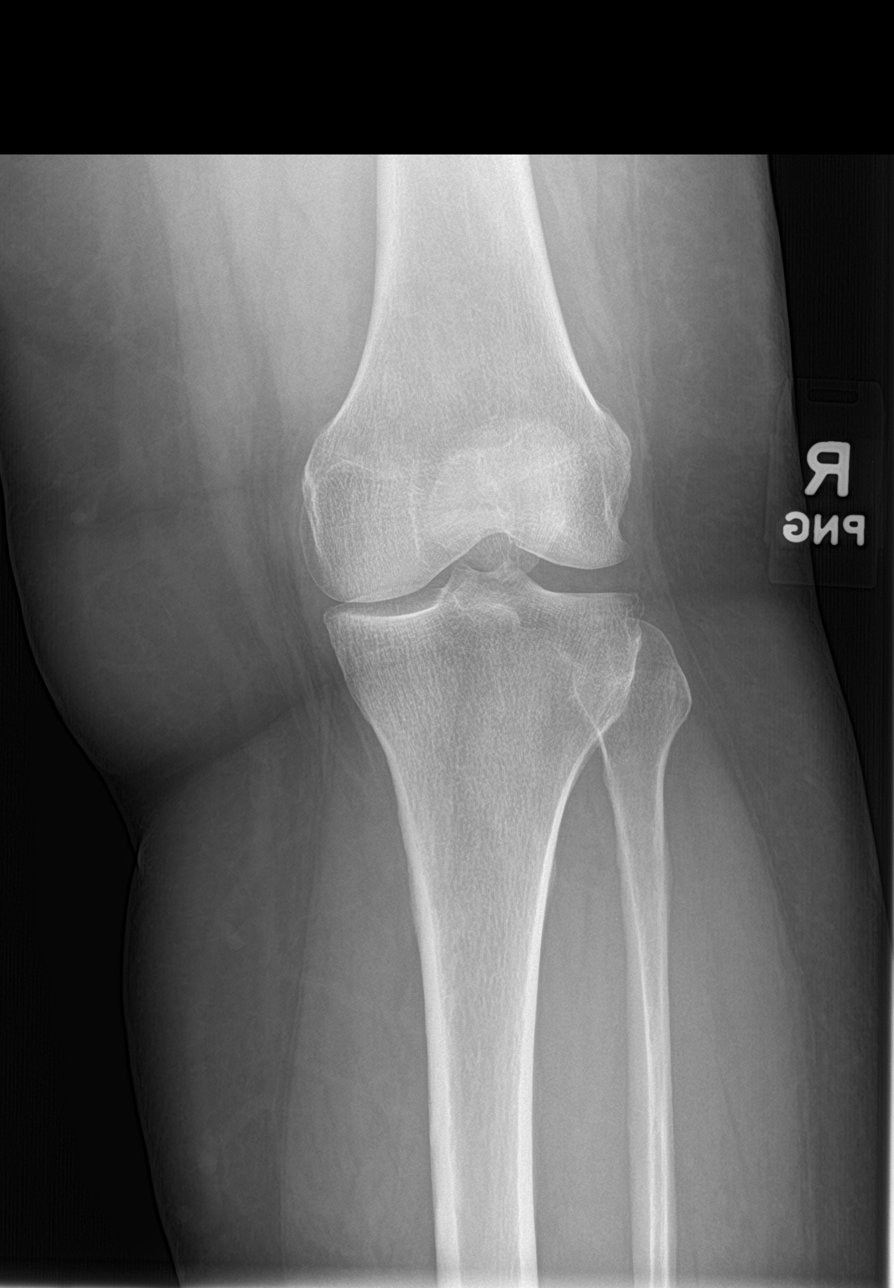

[knee lat]
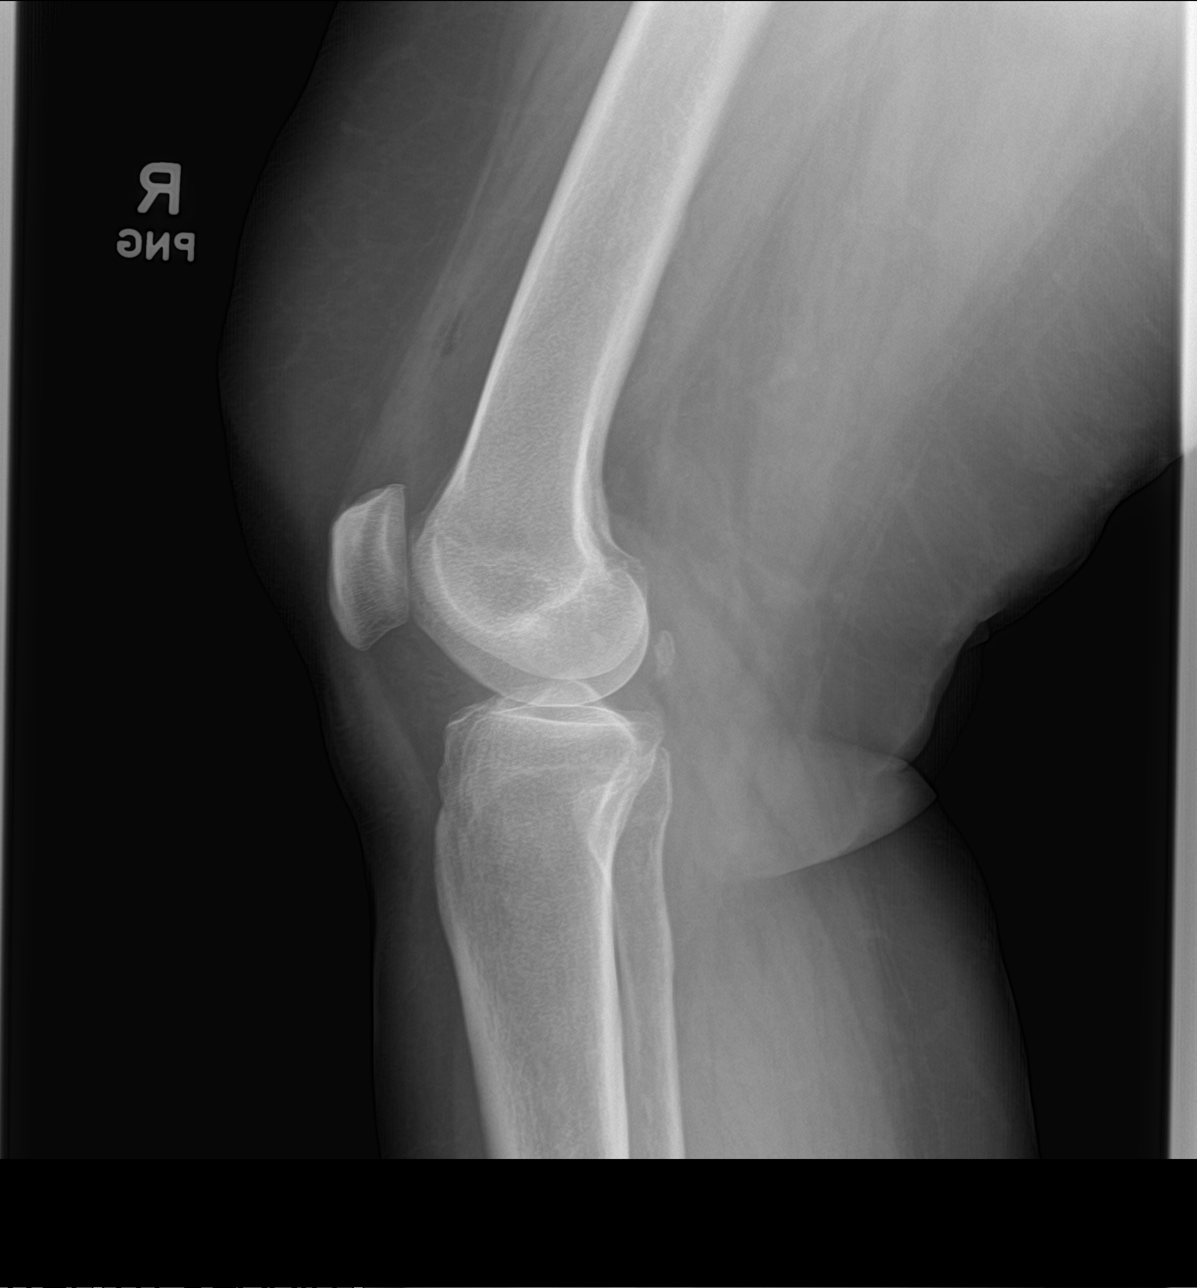

[sunrise]
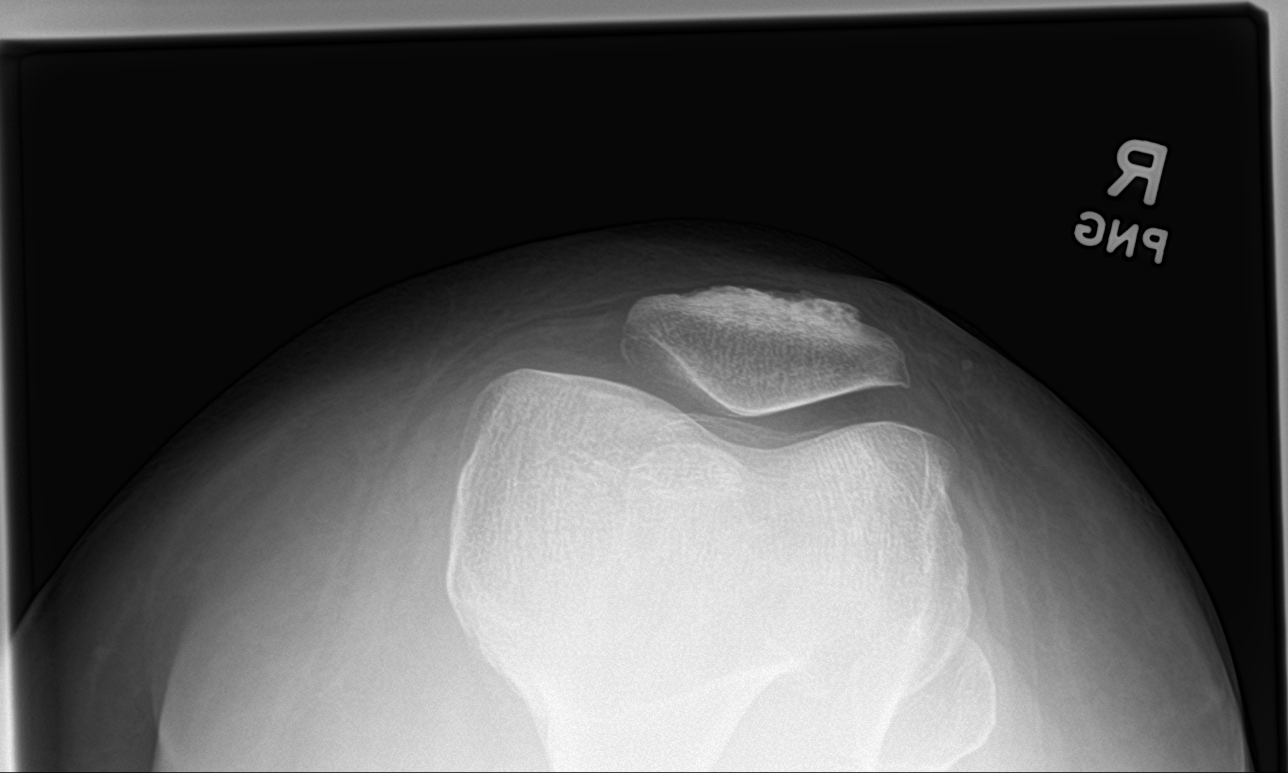

[4 of 4 positions shown; findings below may reference images not displayed]

FINDINGS: There is no evidence of fracture, dislocation, or joint effusion.
Mild narrowing of medial joint space is noted. Soft tissues are
unremarkable.
IMPRESSION: Mild degenerative joint disease is noted medially. No acute
abnormality seen in the right knee.

## 2017-05-08 ENCOUNTER — Ambulatory Visit: Payer: Self-pay | Admitting: Medical

## 2017-05-08 ENCOUNTER — Encounter: Payer: Self-pay | Admitting: Medical

## 2017-05-08 VITALS — BP 125/75 | HR 71 | Temp 98.5°F | Resp 16 | Ht 63.0 in | Wt 219.0 lb

## 2017-05-08 DIAGNOSIS — Z0000 Encounter for general adult medical examination without abnormal findings: Secondary | ICD-10-CM

## 2017-05-08 DIAGNOSIS — Z1231 Encounter for screening mammogram for malignant neoplasm of breast: Secondary | ICD-10-CM

## 2017-05-08 DIAGNOSIS — Z0189 Encounter for other specified special examinations: Secondary | ICD-10-CM

## 2017-05-08 DIAGNOSIS — Z008 Encounter for other general examination: Secondary | ICD-10-CM

## 2017-05-08 NOTE — Progress Notes (Signed)
   Subjective:    Patient ID: Diane Stephenson, female    DOB: 06/11/1960, 57 y.o.   MRN: [id number]  HPI 57 yo female in non acute distress , comes in today for Biometric Screening and order for annual screening mammogram. Works as Science writerdispatcher, says it is stressful and she is waiting till retirement in  3 years. Offered counseling patient declined at this time and voices she will contact us if she needs too.  Patient taking multivitamin  Stopped D and Lipid medication on her own "to see if the vitamin helps her levels"  Non smoker now quit 4 years ago smoked 3693yrs at  1/2 ppd. Rarely uses alcohol.  Married 2 sons one born in [id number] and the other [id number].   Blood pressure 125/75, pulse 71, temperature 98.5 F (36.9 C), temperature source Oral, resp. rate 16, height 5\' 3"  (1.6 m), weight 219 lb (99.3 kg), SpO2 98 %.  Review of Systems  Constitutional: Negative for chills and fever.  HENT: Negative for congestion, ear pain and sore throat.   Eyes: Negative for discharge and itching.  Respiratory: Negative for cough and shortness of breath.   Cardiovascular: Positive for leg swelling (history of ankle swelling x years , no new changes). Negative for chest pain.  Gastrointestinal: Negative for abdominal pain, constipation, diarrhea, nausea and vomiting.  Endocrine: Negative for polydipsia, polyphagia and polyuria.  Genitourinary: Negative for dysuria.  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 Negative for myalgias.  Skin: Negative for rash.  Neurological: Negative for dizziness, syncope and light-headedness.  Hematological: Negative for adenopathy.       Objective:   Physical Exam  Constitutional: She is oriented to person, place, and time. She appears well-developed and well-nourished.  HENT:  Head: Normocephalic and atraumatic.  Right Ear: External ear normal.  Left Ear: External ear normal.  Nose: Nose normal.  Mouth/Throat: Oropharynx is  clear and moist.  Eyes: Conjunctivae are normal. Pupils are equal, round, and reactive to light.  Neck: Normal range of motion. Neck supple. No thyromegaly present.  Cardiovascular: Normal rate, regular rhythm and normal heart sounds.  Pulmonary/Chest: Effort normal and breath sounds normal. No respiratory distress. She has no wheezes. She has no rales.  Musculoskeletal: Normal range of motion.  Lymphadenopathy:    She has no cervical adenopathy.  Neurological: She is alert and oriented to person, place, and time.  Skin: Skin is warm and dry.  Psychiatric: She has a normal mood and affect. Her behavior is normal. Judgment and thought content normal.  Nursing note and vitals reviewed.     Able to flex and extend and rotate trunk.Easily gets on/ off the exam table.    Assessment & Plan:  Well exam Biometric Screening Screening annual mammogram order placed. Labs Pending. Flu Vaccine given today patient received VIS. Return to the clinic as needed. Discussed weight issue difficult for her to exercise due to knees. Says she is going [place] [facility] [place] for a possible injection in left knee, has had one previously in the right knee which helped her pain. Patient verbalizes understanding and has no questions at discharge.

## 2017-05-08 NOTE — Addendum Note (Signed)
Addended by: Catha BrowEACON, MONIQUE T on: 05/08/2017 10:42 AM   Modules accepted: Orders

## 2017-05-09 LAB — CMP12+LP+TP+TSH+6AC+CBC/D/PLT
A/G RATIO: 1.8 (ref 1.2–2.2)
ALT: 17 IU/L (ref 0–32)
AST: 16 IU/L (ref 0–40)
Albumin: 4.6 g/dL (ref 3.5–5.5)
Alkaline Phosphatase: 79 IU/L (ref 39–117)
BUN/Creatinine Ratio: 17 (ref 9–23)
BUN: 14 mg/dL (ref 6–24)
Basophils Absolute: 0 10*3/uL (ref 0.0–0.2)
Basos: 1 %
Bilirubin Total: 0.7 mg/dL (ref 0.0–1.2)
CHLORIDE: 97 mmol/L (ref 96–106)
CREATININE: 0.82 mg/dL (ref 0.57–1.00)
Calcium: 9.8 mg/dL (ref 8.7–10.2)
Chol/HDL Ratio: 5.6 ratio — ABNORMAL HIGH (ref 0.0–4.4)
Cholesterol, Total: 230 mg/dL — ABNORMAL HIGH (ref 100–199)
EOS (ABSOLUTE): 0.2 10*3/uL (ref 0.0–0.4)
ESTIMATED CHD RISK: 1.6 times avg. — AB (ref 0.0–1.0)
Eos: 2 %
Free Thyroxine Index: 1.8 (ref 1.2–4.9)
GFR, EST AFRICAN AMERICAN: 93 mL/min/{1.73_m2} (ref >59)
GFR, EST NON AFRICAN AMERICAN: 81 mL/min/{1.73_m2} (ref >59)
GGT: 17 IU/L (ref 0–60)
GLUCOSE: 124 mg/dL — AB (ref 65–99)
Globulin, Total: 2.6 g/dL (ref 1.5–4.5)
HDL: 41 mg/dL (ref >39)
Hematocrit: 38.5 % (ref 34.0–46.6)
Hemoglobin: 12.8 g/dL (ref 11.1–15.9)
Immature Grans (Abs): 0 10*3/uL (ref 0.0–0.1)
Immature Granulocytes: 0 %
Iron: 103 ug/dL (ref 27–159)
LDH: 207 IU/L (ref 119–226)
LDL Calculated: 166 mg/dL — ABNORMAL HIGH (ref 0–99)
Lymphocytes Absolute: 1.8 10*3/uL (ref 0.7–3.1)
Lymphs: 25 %
MCH: 28.1 pg (ref 26.6–33.0)
MCHC: 33.2 g/dL (ref 31.5–35.7)
MCV: 85 fL (ref 79–97)
MONOCYTES: 6 %
Monocytes Absolute: 0.5 10*3/uL (ref 0.1–0.9)
NEUTROS ABS: 4.9 10*3/uL (ref 1.4–7.0)
Neutrophils: 66 %
PHOSPHORUS: 3.4 mg/dL (ref 2.5–4.5)
Platelets: 248 10*3/uL (ref 150–379)
Potassium: 4.5 mmol/L (ref 3.5–5.2)
RBC: 4.55 x10E6/uL (ref 3.77–5.28)
RDW: 13.1 % (ref 12.3–15.4)
SODIUM: 135 mmol/L (ref 134–144)
T3 Uptake Ratio: 28 % (ref 24–39)
T4 TOTAL: 6.4 ug/dL (ref 4.5–12.0)
TOTAL PROTEIN: 7.2 g/dL (ref 6.0–8.5)
TSH: 0.821 u[IU]/mL (ref 0.450–4.500)
Triglycerides: 115 mg/dL (ref 0–149)
URIC ACID: 5.4 mg/dL (ref 2.5–7.1)
VLDL CHOLESTEROL CAL: 23 mg/dL (ref 5–40)
WBC: 7.4 10*3/uL (ref 3.4–10.8)

## 2017-05-09 LAB — VITAMIN D 25 HYDROXY (VIT D DEFICIENCY, FRACTURES): VIT D 25 HYDROXY: 33.3 ng/mL (ref 30.0–100.0)

## 2017-05-09 LAB — HGB A1C W/O EAG: Hgb A1c MFr Bld: 5.9 % — ABNORMAL HIGH (ref 4.8–5.6)

## 2017-05-19 ENCOUNTER — Ambulatory Visit: Payer: Self-pay | Admitting: Emergency Medicine

## 2017-05-19 VITALS — BP 100/70 | HR 73 | Temp 98.0°F | Resp 16

## 2017-05-19 DIAGNOSIS — J069 Acute upper respiratory infection, unspecified: Secondary | ICD-10-CM

## 2017-05-19 MED ORDER — BENZONATATE 100 MG PO CAPS: 100.0000 mg | ORAL_CAPSULE | Freq: Three times a day (TID) | ORAL | 0 refills | Status: DC | PRN

## 2017-05-19 NOTE — Progress Notes (Signed)
Good news decreased A1C. Increased Vitamin D3 level to continue  1000 IU/day recheck in  3 months.  Cholesterol up , but it was the holidays.  Low cholesterol diet and exercise is recommended recheck in  6 months. TY Heather

## 2017-05-19 NOTE — Patient Instructions (Signed)
Upper Respiratory Infection, Adult Most upper respiratory infections (URIs) are caused by a virus. A URI affects the nose, throat, and upper air passages. The most common type of URI is often called "the common cold." Follow these instructions at home:  Take medicines only as told by your doctor.  Gargle warm saltwater or take cough drops to comfort your throat as told by your doctor.  Use a warm mist humidifier or inhale steam from a shower to increase air moisture. This may make it easier to breathe.  Drink enough fluid to keep your pee (urine) clear or pale yellow.  Eat soups and other clear broths.  Have a healthy diet.  Rest as needed.  Go back to work when your fever is gone or your doctor says it is okay. ? You may need to stay home longer to avoid giving your URI to others. ? You can also wear a face mask and wash your hands often to prevent spread of the virus.  Use your inhaler more if you have asthma.  Do not use any tobacco products, including cigarettes, chewing tobacco, or electronic cigarettes. If you need help quitting, ask your doctor. Contact a doctor if:  You are getting worse, not better.  Your symptoms are not helped by medicine.  You have chills.  You are getting more short of breath.  You have brown or red mucus.  You have yellow or brown discharge from your nose.  You have pain in your face, especially when you bend forward.  You have a fever.  You have puffy (swollen) neck glands.  You have pain while swallowing.  You have white areas in the back of your throat. Get help right away if:  You have very bad or constant: ? Headache. ? Ear pain. ? Pain in your forehead, behind your eyes, and over your cheekbones (sinus pain). ? Chest pain.  You have long-lasting (chronic) lung disease and any of the following: ? Wheezing. ? Long-lasting cough. ? Coughing up blood. ? A change in your usual mucus.  You have a stiff neck.  You have  changes in your: ? Vision. ? Hearing. ? Thinking. ? Mood. This information is not intended to replace advice given to you by your health care provider. Make sure you discuss any questions you have with your health care provider. Document Released: 10/01/2007 Document Revised: 12/16/2015 Document Reviewed: 07/20/2013 Elsevier Interactive Patient Education  2018 Elsevier Inc.  

## 2017-05-19 NOTE — Progress Notes (Signed)
Subjective.  Onset of symptoms yesterday with sore throat and nasal congestion. Her sore throat is better today but she continues to have significant nasal congestion. She is currently on loratadine and Flonase Objective. Gen: Alert and cooperative in no acute distress. HEENT exam both TMs occluded with wax. Nose is congested ROM appearing. Throat is slightly red. Neck supple without adenopathy. Chest clear to auscultation. Rapid strep test pending Assessment. Upper respiratory infection. Rapid strep negative Plan Continue Claritin and Flonase. Tessalon Perles. Out of work [employer]Marland Kitchen

## 2017-08-28 ENCOUNTER — Other Ambulatory Visit: Payer: Self-pay | Admitting: Family Medicine

## 2017-08-28 ENCOUNTER — Telehealth: Payer: Self-pay

## 2017-08-28 DIAGNOSIS — I10 Essential (primary) hypertension: Secondary | ICD-10-CM

## 2017-08-28 MED ORDER — LISINOPRIL-HYDROCHLOROTHIAZIDE 10-12.5 MG PO TABS: 1.0000 | ORAL_TABLET | Freq: Every day | ORAL | 0 refills | Status: DC

## 2017-08-28 NOTE — Telephone Encounter (Signed)
Left voicemail to return call. Tried to contact pt about Lisinopril refill.

## 2017-08-28 NOTE — Progress Notes (Signed)
Patient submitted a refill request for lisinopril/hydrochlorothiazide.  Provided her with 1 refill to allow her time to establish care with a primary care provider, who will be responsible for providing refills in the future.

## 2017-08-31 ENCOUNTER — Telehealth: Payer: Self-pay

## 2017-08-31 NOTE — Telephone Encounter (Deleted)
c 

## 2017-08-31 NOTE — Telephone Encounter (Signed)
Spoke to pt. Informed her of lisinopril refill and that future refills come from a PCP. Pt gave verbal understanding.

## 2018-02-12 ENCOUNTER — Ambulatory Visit: Payer: Managed Care, Other (non HMO) | Admitting: Family Medicine

## 2018-02-12 ENCOUNTER — Encounter: Payer: Self-pay | Admitting: Family Medicine

## 2018-02-12 VITALS — BP 150/80 | HR 61 | Temp 98.0°F | Ht 63.0 in | Wt 209.0 lb

## 2018-02-12 DIAGNOSIS — N644 Mastodynia: Secondary | ICD-10-CM | POA: Diagnosis not present

## 2018-02-12 DIAGNOSIS — Z1231 Encounter for screening mammogram for malignant neoplasm of breast: Secondary | ICD-10-CM | POA: Diagnosis not present

## 2018-02-12 DIAGNOSIS — K219 Gastro-esophageal reflux disease without esophagitis: Secondary | ICD-10-CM | POA: Insufficient documentation

## 2018-02-12 DIAGNOSIS — J3489 Other specified disorders of nose and nasal sinuses: Secondary | ICD-10-CM

## 2018-02-12 DIAGNOSIS — Z23 Encounter for immunization: Secondary | ICD-10-CM | POA: Diagnosis not present

## 2018-02-12 DIAGNOSIS — I10 Essential (primary) hypertension: Secondary | ICD-10-CM

## 2018-02-12 MED ORDER — FLUTICASONE PROPIONATE 50 MCG/ACT NA SUSP: 1.0000 | Freq: Every day | NASAL | 5 refills | Status: AC

## 2018-02-12 MED ORDER — LISINOPRIL-HYDROCHLOROTHIAZIDE 10-12.5 MG PO TABS: 1.0000 | ORAL_TABLET | Freq: Every day | ORAL | 3 refills | Status: DC

## 2018-02-12 MED ORDER — OMEPRAZOLE 20 MG PO CPDR: 20.0000 mg | DELAYED_RELEASE_CAPSULE | Freq: Every day | ORAL | 3 refills | Status: DC

## 2018-02-12 NOTE — Progress Notes (Signed)
Subjective:    Patient ID: Diane Stephenson, female    DOB: 1960/07/13, 57 y.o.   MRN: [id number]  HPI  Patient presents to clinic to establish with new PCP.  Patient has been taking Prilosec and lisinopril-hydrochlorothiazide for years with good effect on GERD and blood pressure, but currently has ran out of these medications.  Patient also uses Claritin to control seasonal allergy symptoms.  Patient's other main concern today is pain in right breast.  States she has done multiple different self breast exams, has not felt any masses or lumps.  Denies any discharge from nipples.  Patient is postmenopausal.  Describes the breast pain as a burning type sensation.  Patient Active Problem List   Diagnosis Date Noted  . Essential hypertension 02/12/2018  . GERD without esophagitis 02/12/2018  . Posterior rhinorrhea 02/12/2018  . Arthritis of right knee 05/14/2015  . Pes anserine bursitis 05/14/2015  . Patellofemoral pain syndrome 05/14/2015  . Allergic rhinitis 03/27/2015  . Duodenitis 03/27/2015  . Can't get food down 03/27/2015  . Atypical chest pain 03/06/2015  . Family history of colonic polyps 12/28/2013   Social History   Tobacco Use  . Smoking status: Former Games developer  . Smokeless tobacco: Never Used  Substance Use Topics  . Alcohol use: No    Alcohol/week: 0.0 standard drinks   Past Surgical History:  Procedure Laterality Date  . ABDOMINAL HYSTERECTOMY    . NASAL SEPTUM SURGERY  2001  . throat surgery     Family History  Problem Relation Age of Onset  . Thyroid disease Mother   . Dementia Mother   . Heart disease Father   . Hyperlipidemia Father   . Heart attack Father   . Breast cancer Sister   . Alcohol abuse Maternal Grandmother   . Cancer Paternal Grandmother   . Heart attack Paternal Grandmother   . Stroke Paternal Grandmother   . Diabetes Paternal Grandmother    Review of Systems  Constitutional: Negative for chills, fatigue and fever.  HENT: Negative  for congestion, ear pain, sinus pain and sore throat.   Eyes: Negative.   Respiratory: Negative for cough, shortness of breath and wheezing.   Cardiovascular: Negative for chest pain, palpitations and leg swelling.  Chest: Right breast pain.  Gastrointestinal: Negative for abdominal pain, diarrhea, nausea and vomiting.  Genitourinary: Negative for dysuria, frequency and urgency.  Musculoskeletal: Negative for arthralgias and myalgias.  Skin: Negative for color change, pallor and rash.  Neurological: Negative for syncope, light-headedness and headaches.  Psychiatric/Behavioral: The patient is not nervous/anxious.       Objective:   Physical Exam  Constitutional: She is oriented to person, place, and time. She appears well-nourished. No distress.  HENT:  Head: Normocephalic and atraumatic.  Eyes: EOM are normal. No scleral icterus.  Neck: Neck supple. No JVD present. No tracheal deviation present.  Cardiovascular: Normal rate and regular rhythm.  Pulmonary/Chest: Effort normal and breath sounds normal. No respiratory distress. Right breast exhibits tenderness. Right breast exhibits no inverted nipple, no mass, no nipple discharge and no skin change. Left breast exhibits no inverted nipple, no mass, no nipple discharge, no skin change and no tenderness.  Tenderness on right breast at approx the 6 o'clock position. No masses, lumps felt. No skin changes.     Abdominal: Soft. Bowel sounds are normal. She exhibits no distension. There is no tenderness.  Musculoskeletal: Normal range of motion. She exhibits no edema.  Neurological: She is alert and oriented to person,  place, and time. No cranial nerve deficit.  Skin: Skin is warm and dry. She is not diaphoretic.  Psychiatric: She has a normal mood and affect. Her behavior is normal. Thought content normal.  Nursing note and vitals reviewed.  Vitals:   02/12/18 1315  BP: (!) 150/80  Pulse: 61  Temp: 98 F (36.7 C)  SpO2: 99%        Assessment & Plan:   Right breast pain/visit for screening mammogram - patient is due for mammogram screening so a mammogram will help [place] both screen for breast cancer in also further investigate because of right breast pain.  Essential hypertension - patient's lisinopril hydrochlorothiazide medication refilled.  Patient will keep a log of readings at home, and will make [place] aware if they continue to trend high.  GERD - patient will continue to take omeprazole to treat GERD symptoms.  Posterior rhinorrhea - patient uses Claritin to help treat this with success, she will continue to do so.  Blood work orders printed off for patient - gets them drawn for free at her job.   Flu vaccine given in clinic today.  Patient will follow-up in 3 months for monitoring of high blood pressure and other chronic conditions.  Patient is aware she can return to clinic sooner if any issues arise.

## 2018-02-15 ENCOUNTER — Other Ambulatory Visit: Payer: Self-pay

## 2018-02-15 DIAGNOSIS — I10 Essential (primary) hypertension: Secondary | ICD-10-CM

## 2018-02-16 LAB — COMPREHENSIVE METABOLIC PANEL
A/G RATIO: 2 (ref 1.2–2.2)
ALT: 14 IU/L (ref 0–32)
AST: 12 IU/L (ref 0–40)
Albumin: 3.9 g/dL (ref 3.5–5.5)
Alkaline Phosphatase: 68 IU/L (ref 39–117)
BILIRUBIN TOTAL: 0.3 mg/dL (ref 0.0–1.2)
BUN/Creatinine Ratio: 25 — ABNORMAL HIGH (ref 9–23)
BUN: 18 mg/dL (ref 6–24)
CHLORIDE: 103 mmol/L (ref 96–106)
CO2: 22 mmol/L (ref 20–29)
Calcium: 9 mg/dL (ref 8.7–10.2)
Creatinine, Ser: 0.71 mg/dL (ref 0.57–1.00)
GFR calc non Af Amer: 96 mL/min/{1.73_m2} (ref >59)
GFR, EST AFRICAN AMERICAN: 110 mL/min/{1.73_m2} (ref >59)
Globulin, Total: 2 g/dL (ref 1.5–4.5)
Glucose: 99 mg/dL (ref 65–99)
POTASSIUM: 4.5 mmol/L (ref 3.5–5.2)
Sodium: 141 mmol/L (ref 134–144)
TOTAL PROTEIN: 5.9 g/dL — AB (ref 6.0–8.5)

## 2018-02-16 LAB — CBC WITH DIFFERENTIAL/PLATELET
BASOS ABS: 0 10*3/uL (ref 0.0–0.2)
Basos: 1 %
EOS (ABSOLUTE): 0.2 10*3/uL (ref 0.0–0.4)
Eos: 3 %
Hematocrit: 36.8 % (ref 34.0–46.6)
Hemoglobin: 12.1 g/dL (ref 11.1–15.9)
IMMATURE GRANS (ABS): 0 10*3/uL (ref 0.0–0.1)
Immature Granulocytes: 0 %
LYMPHS ABS: 1.6 10*3/uL (ref 0.7–3.1)
LYMPHS: 30 %
MCH: 27 pg (ref 26.6–33.0)
MCHC: 32.9 g/dL (ref 31.5–35.7)
MCV: 82 fL (ref 79–97)
Monocytes Absolute: 0.4 10*3/uL (ref 0.1–0.9)
Monocytes: 7 %
Neutrophils Absolute: 3.2 10*3/uL (ref 1.4–7.0)
Neutrophils: 59 %
PLATELETS: 173 10*3/uL (ref 150–450)
RBC: 4.48 x10E6/uL (ref 3.77–5.28)
RDW: 12.7 % (ref 12.3–15.4)
WBC: 5.5 10*3/uL (ref 3.4–10.8)

## 2018-02-16 LAB — LIPID PANEL
CHOLESTEROL TOTAL: 196 mg/dL (ref 100–199)
Chol/HDL Ratio: 4.7 ratio — ABNORMAL HIGH (ref 0.0–4.4)
HDL: 42 mg/dL (ref >39)
LDL Calculated: 133 mg/dL — ABNORMAL HIGH (ref 0–99)
Triglycerides: 103 mg/dL (ref 0–149)
VLDL Cholesterol Cal: 21 mg/dL (ref 5–40)

## 2018-02-16 LAB — TSH: TSH: 0.576 u[IU]/mL (ref 0.450–4.500)

## 2018-02-16 NOTE — Progress Notes (Addendum)
Routed Lab results to Ordering Provider Leanora Cover FNP 02/16/18 [employer] Audrena Talaga CMA

## 2018-02-17 ENCOUNTER — Telehealth: Payer: Self-pay | Admitting: Family Medicine

## 2018-02-17 NOTE — Telephone Encounter (Signed)
-----   Message from Garrison Columbus, RN sent at 02/17/2018 10:35 AM EDT ----- Pt given lab results per notes of Leanora Cover FNP on 02/17/18. Pt verbalized understanding. Pt stated she is willing to start low dose statin. Please call in to [facility] on file.

## 2018-02-17 NOTE — Telephone Encounter (Signed)
Pt asking if referral was made for her mammogram. Pt stated she has not heard form anyone to schedule it.

## 2018-02-18 ENCOUNTER — Other Ambulatory Visit: Payer: Self-pay | Admitting: Family Medicine

## 2018-02-18 DIAGNOSIS — N644 Mastodynia: Secondary | ICD-10-CM

## 2018-02-18 DIAGNOSIS — Z1231 Encounter for screening mammogram for malignant neoplasm of breast: Secondary | ICD-10-CM

## 2018-02-19 NOTE — Telephone Encounter (Signed)
Called Pt to give her info in regards to setting up a mammogram appt. The Pt would have to set up her own appt we dont do referrals for mammograms. We have [facility] in [place] [address] at [phone] and we have [facility] imaging [address] at [phone]. We also have 2 in [place] and 1 in [place] if those 2 in [place] are not close for you.

## 2018-02-19 NOTE — Telephone Encounter (Signed)
Left message for pt. To call back and discuss mammogram appointment.

## 2018-02-19 NOTE — Addendum Note (Signed)
Addended by: Karen Kays on: 02/19/2018 02:20 PM   Modules accepted: Level of Service

## 2018-02-24 NOTE — Telephone Encounter (Signed)
Called Pt to give her some info and phone numbers of mammogram places that she can go to.

## 2018-03-02 LAB — HM MAMMOGRAPHY

## 2018-11-26 ENCOUNTER — Encounter (INDEPENDENT_AMBULATORY_CARE_PROVIDER_SITE_OTHER): Payer: Self-pay

## 2019-02-15 ENCOUNTER — Ambulatory Visit: Payer: Managed Care, Other (non HMO) | Admitting: Adult Health

## 2019-02-15 ENCOUNTER — Encounter: Payer: Self-pay | Admitting: Adult Health

## 2019-02-15 ENCOUNTER — Other Ambulatory Visit: Payer: Self-pay

## 2019-02-15 VITALS — BP 140/88 | HR 72 | Temp 97.1°F | Resp 16 | Ht 64.0 in | Wt 229.0 lb

## 2019-02-15 DIAGNOSIS — R1310 Dysphagia, unspecified: Secondary | ICD-10-CM | POA: Insufficient documentation

## 2019-02-15 DIAGNOSIS — R030 Elevated blood-pressure reading, without diagnosis of hypertension: Secondary | ICD-10-CM

## 2019-02-15 DIAGNOSIS — Z008 Encounter for other general examination: Secondary | ICD-10-CM | POA: Diagnosis not present

## 2019-02-15 NOTE — Patient Instructions (Signed)
  I will have the office call you on your glucose and cholesterol results when they return if you have not heard within 1 week please call the office.  This biometric physical is a brief physical and the only labs done are glucose and your lipid panel(cholesterol) and is  not a substitute for seeing a primary care provider for a complete annual physical. Please see a primary care physician for routine health maintenance, labs and full physical at least yearly and follow up as recommended by your provider. Provider also recommends if you do not have a primary care provider for patient to establish care as soon as possible .Patient may chose provider of choice. Also gave the [place] [facility]  PHYSICIAN/PROVIDER  REFERRAL LINE at 1-800-449- 8688 or web site at [email or web address] to help assist with finding a primary care doctor.  Patient verbalizes understanding that his office is acute care only and not a substitute for a primary care or for the management of chronic conditions.    Health Maintenance, Female Adopting a healthy lifestyle and getting preventive care are important in promoting health and wellness. Ask your health care provider about:  The right schedule for you to have regular tests and exams.  Things you can do on your own to prevent diseases and keep yourself healthy. What should I know about diet, weight, and exercise? Eat a healthy diet   Eat a diet that includes plenty of vegetables, fruits, low-fat dairy products, and lean protein.  Do not eat a lot of foods that are high in solid fats, added sugars, or sodium. Maintain a healthy weight Body mass index (BMI) is used to identify weight problems. It estimates body fat based on height and weight. Your health care provider can help determine your BMI and help you achieve or maintain a healthy weight. Get regular exercise Get regular exercise. This is one of the most important things you can do for your health. Most adults should:   Exercise for at least 150 minutes each week. The exercise should increase your heart rate and make you sweat (moderate-intensity exercise).  Do strengthening exercises at least twice a week. This is in addition to the moderate-intensity exercise.  Spend less time sitting. Even light physical activity can be beneficial. Watch cholesterol and blood lipids Have your blood tested for lipids and cholesterol at 57 years of age, then have this test every 5 years. Have your cholesterol levels checked more often if:  Your lipid or cholesterol levels are high.  You are older than 57 years of age.  You are at high risk for heart disease. What should I know about cancer screening? Depending on your health history and family history, you may need to have cancer screening at various ages. This may include screening for:  Breast cancer.  Cervical cancer.  Colorectal cancer.  Skin cancer.  Lung cancer. What should I know about heart disease, diabetes, and high blood pressure? Blood pressure and heart disease  High blood pressure causes heart disease and increases the risk of stroke. This is more likely to develop in people who have high blood pressure readings, are of African descent, or are overweight.  Have your blood pressure checked: ? Every 3-5 years if you are 18-39 years of age. ? Every year if you are 40 years old or older. Diabetes Have regular diabetes screenings. This checks your fasting blood sugar level. Have the screening done:  Once every three years after age 40 if you are   at a normal weight and have a low risk for diabetes.  More often and at a younger age if you are overweight or have a high risk for diabetes. What should I know about preventing infection? Hepatitis B If you have a higher risk for hepatitis B, you should be screened for this virus. Talk with your health care provider to find out if you are at risk for hepatitis B infection. Hepatitis C Testing is  recommended for:  Everyone born from 1945 through 1965.  Anyone with known risk factors for hepatitis C. Sexually transmitted infections (STIs)  Get screened for STIs, including gonorrhea and chlamydia, if: ? You are sexually active and are younger than 57 years of age. ? You are older than 57 years of age and your health care provider tells you that you are at risk for this type of infection. ? Your sexual activity has changed since you were last screened, and you are at increased risk for chlamydia or gonorrhea. Ask your health care provider if you are at risk.  Ask your health care provider about whether you are at high risk for HIV. Your health care provider may recommend a prescription medicine to help prevent HIV infection. If you choose to take medicine to prevent HIV, you should first get tested for HIV. You should then be tested every 3 months for as long as you are taking the medicine. Pregnancy  If you are about to stop having your period (premenopausal) and you may become pregnant, seek counseling before you get pregnant.  Take 400 to 800 micrograms (mcg) of folic acid every day if you become pregnant.  Ask for birth control (contraception) if you want to prevent pregnancy. Osteoporosis and menopause Osteoporosis is a disease in which the bones lose minerals and strength with aging. This can result in bone fractures. If you are 65 years old or older, or if you are at risk for osteoporosis and fractures, ask your health care provider if you should:  Be screened for bone loss.  Take a calcium or vitamin D supplement to lower your risk of fractures.  Be given hormone replacement therapy (HRT) to treat symptoms of menopause. Follow these instructions at home: Lifestyle  Do not use any products that contain nicotine or tobacco, such as cigarettes, e-cigarettes, and chewing tobacco. If you need help quitting, ask your health care provider.  Do not use street drugs.  Do not  share needles.  Ask your health care provider for help if you need support or information about quitting drugs. Alcohol use  Do not drink alcohol if: ? Your health care provider tells you not to drink. ? You are pregnant, may be pregnant, or are planning to become pregnant.  If you drink alcohol: ? Limit how much you use to 0-1 drink a day. ? Limit intake if you are breastfeeding.  Be aware of how much alcohol is in your drink. In the [place] one drink equals one 12 oz bottle of beer (355 mL), one 5 oz glass of wine (148 mL), or one 1 oz glass of hard liquor (44 mL). General instructions  Schedule regular health, dental, and eye exams.  Stay current with your vaccines.  Tell your health care provider if: ? You often feel depressed. ? You have ever been abused or do not feel safe at home. Summary  Adopting a healthy lifestyle and getting preventive care are important in promoting health and wellness.  Follow your health care provider's instructions about   healthy diet, exercising, and getting tested or screened for diseases.  Follow your health care provider's instructions on monitoring your cholesterol and blood pressure. This information is not intended to replace advice given to you by your health care provider. Make sure you discuss any questions you have with your health care provider. Document Released: 10/28/2010 Document Revised: 04/07/2018 Document Reviewed: 04/07/2018 Elsevier Patient Education  2020 Elsevier Inc.  

## 2019-02-15 NOTE — Progress Notes (Signed)
[place] DOB: 58 y.o. MRN: [id number]  Subjective:  Here for Biometric Screen/brief exam Patient is a 58 year old female who comes to the clinic for her biometric screening and brief exam.   She denies any concerns for today's visit.  She reports she sees her primary care regularly Guse, Jacquelynn Cree, FNP.   Patient  denies any fever, body aches,chills, rash, chest pain, shortness of breath, nausea, vomiting, or diarrhea.   She request Flu immunization at today's visit. She denies any allergy to flu immunization in past or any allergy to eggs or any previous reaction or concern.   Allergies  Allergen Reactions  . Septra [Sulfamethoxazole-Trimethoprim] Rash    Patient Active Problem List   Diagnosis Date Noted  . Dysphagia 02/15/2019  . Essential hypertension 02/12/2018  . GERD without esophagitis 02/12/2018  . Posterior rhinorrhea 02/12/2018  . Arthritis of right knee 05/14/2015  . Pes anserine bursitis 05/14/2015  . Patellofemoral pain syndrome 05/14/2015  . Allergic rhinitis 03/27/2015  . Duodenitis 03/27/2015  . Can't get food down 03/27/2015  . Atypical chest pain 03/06/2015  . Chest pain, atypical 03/06/2015  . Family history of colonic polyps 12/28/2013  . Family history of polyps in the colon 12/28/2013     Current Outpatient Medications:  .  fluticasone (FLONASE) 50 MCG/ACT nasal spray, Place 1 spray into both nostrils daily., Disp: 16 g, Rfl: 5 .  lisinopril-hydrochlorothiazide (PRINZIDE,ZESTORETIC) 10-12.5 MG tablet, Take 1 tablet by mouth daily., Disp: 90 tablet, Rfl: 3 .  loratadine (CLARITIN) 10 MG tablet, Take by mouth., Disp: , Rfl:  .  Multiple Vitamin (MULTI-VITAMINS) TABS, Take by mouth., Disp: , Rfl:  .  omeprazole (PRILOSEC) 20 MG capsule, Take 1 capsule (20 mg total) by mouth daily., Disp: 90 capsule, Rfl: 3 .  potassium gluconate 595 (99 K) MG TABS tablet, Take by mouth., Disp: , Rfl:    Objective:  Blood pressure 140/88, pulse 72, temperature (!) 97.1 F (36.2 C), temperature source Temporal, resp. rate 16, height 5\' 4"  (1.626 m), weight 229 lb (103.9 kg), SpO2 100 %. Temporal thermometer reading one degree less than oral thermometer in this clinic.  Patient is alert and oriented and responsive to questions Engages in eye contact with provider. Speaks in full sentences without any pauses without any shortness of breath or distress.  NAD, well developed, well nourished HEENT: Within normal limits Neck: Normal, supple, thyroid normal, no cervical lymphademnopathy Heart: Regular rate and rhythm without murmurs, rubs, or gallops  Lungs: Clear to auscultation without any adventitious lung sounds  Patient moves on and off of exam table and in room without difficulty. Gait is normal in hall and in room.   Assessment:  Biometric screen  The primary encounter diagnosis was Encounter for biometric screening. Diagnoses of Encounter for other general examination- not a full annual physical - biometric sceening with brief exam and Elevated blood pressure reading were also pertinent to this visit.  Plan: Follow up with your primary care provider in 1 week for hypertension/ blood pressure recheck. Keep log of morning and evening readings.   I will have the office call you on your glucose and cholesterol results when they return if you have not heard within 1 week please call the office.  This biometric physical is a brief physical and the only labs done are glucose and your lipid panel(cholesterol) and is  not a substitute for seeing a primary care provider for a  complete annual physical. Please see a primary care physician for routine health maintenance, labs and full physical at least yearly and follow up as recommended by your provider. Provider also recommends if you do not have a primary care provider for patient to establish care as soon as possible .Patient may chose provider of  choice. Also gave the [facility]  PHYSICIAN/PROVIDER  REFERRAL LINE at [phone] or web site at [email or web address] to help assist with finding a primary care doctor.  Patient verbalizes understanding that his office is acute care only and not a substitute for a primary care or for the management of chronic conditions.    Fasting glucose and lipids. Discussed with patient that today's visit here is a limited biometric screening visit (not a comprehensive exam or management of any chronic problems) Discussed some health issues, including healthy eating habits and exercise. Encouraged to follow-up with PCP for annual comprehensive preventive and wellness care (and if applicable, any chronic issues). Questions invited and answered.

## 2019-02-16 ENCOUNTER — Encounter: Payer: Self-pay | Admitting: Adult Health

## 2019-02-16 LAB — LIPID PANEL
Chol/HDL Ratio: 7.1 ratio — ABNORMAL HIGH (ref 0.0–4.4)
Cholesterol, Total: 271 mg/dL — ABNORMAL HIGH (ref 100–199)
HDL: 38 mg/dL — ABNORMAL LOW (ref >39)
LDL Chol Calc (NIH): 197 mg/dL — ABNORMAL HIGH (ref 0–99)
Triglycerides: 188 mg/dL — ABNORMAL HIGH (ref 0–149)
VLDL Cholesterol Cal: 36 mg/dL (ref 5–40)

## 2019-02-16 LAB — GLUCOSE, RANDOM: Glucose: 116 mg/dL — ABNORMAL HIGH (ref 65–99)

## 2019-02-21 ENCOUNTER — Telehealth: Payer: Self-pay

## 2019-02-21 ENCOUNTER — Other Ambulatory Visit: Payer: Self-pay | Admitting: Family Medicine

## 2019-02-21 DIAGNOSIS — I10 Essential (primary) hypertension: Secondary | ICD-10-CM

## 2019-02-21 DIAGNOSIS — K219 Gastro-esophageal reflux disease without esophagitis: Secondary | ICD-10-CM

## 2019-02-21 NOTE — Telephone Encounter (Signed)
Copied from [place] [id number]. Topic: General - Other >> Feb 21, 2019  1:41 PM Yvette Rack wrote: Reason for CRM: Pt stated she was returning call to [facility]. Attempted to reach [facility] by Skype but did not receive a response. Pt requests call back.

## 2019-02-21 NOTE — Telephone Encounter (Signed)
Patients labs

## 2019-02-21 NOTE — Telephone Encounter (Signed)
When did the Pt state that I called her ? Also did she say why

## 2019-02-21 NOTE — Telephone Encounter (Signed)
Called Pt and scheduled her a OV appt for 02/23/19 @ 9:00am. To discuss lab results.

## 2019-02-23 ENCOUNTER — Ambulatory Visit: Payer: Managed Care, Other (non HMO) | Admitting: Family Medicine

## 2019-02-23 ENCOUNTER — Other Ambulatory Visit: Payer: Self-pay

## 2019-02-23 ENCOUNTER — Encounter: Payer: Self-pay | Admitting: Family Medicine

## 2019-02-23 VITALS — BP 144/78 | HR 68 | Temp 96.9°F | Ht 64.0 in | Wt 231.4 lb

## 2019-02-23 DIAGNOSIS — I10 Essential (primary) hypertension: Secondary | ICD-10-CM | POA: Diagnosis not present

## 2019-02-23 DIAGNOSIS — K219 Gastro-esophageal reflux disease without esophagitis: Secondary | ICD-10-CM

## 2019-02-23 DIAGNOSIS — E782 Mixed hyperlipidemia: Secondary | ICD-10-CM

## 2019-02-23 LAB — HEMOGLOBIN A1C: Hgb A1c MFr Bld: 6.2 % (ref 4.6–6.5)

## 2019-02-23 MED ORDER — ROSUVASTATIN CALCIUM 10 MG PO TABS: 10.0000 mg | ORAL_TABLET | Freq: Every day | ORAL | 3 refills | Status: DC

## 2019-02-23 MED ORDER — OMEPRAZOLE 20 MG PO CPDR: 20.0000 mg | DELAYED_RELEASE_CAPSULE | Freq: Every day | ORAL | 3 refills | Status: AC

## 2019-02-23 MED ORDER — LISINOPRIL-HYDROCHLOROTHIAZIDE 10-12.5 MG PO TABS: 1.0000 | ORAL_TABLET | Freq: Every day | ORAL | 3 refills | Status: DC

## 2019-02-23 NOTE — Patient Instructions (Signed)

## 2019-02-23 NOTE — Progress Notes (Signed)
Subjective:    Patient ID: Diane Stephenson, female    DOB: January 25, 1961, 58 y.o.   MRN: [id number]  HPI   Patient presents to clinic to discuss lab results.  Patient had biometric screening through her employer, showed elevated cholesterol and elevated glucose.  Lipid Panel     Component Value Date/Time   CHOL 271 (H) 02/15/2019 1118   TRIG 188 (H) 02/15/2019 1118   HDL 38 (L) 02/15/2019 1118   CHOLHDL 7.1 (H) 02/15/2019 1118   LDLCALC 197 (H) 02/15/2019 1118   LABVLDL 36 02/15/2019 1118     Patient does have history also of hypertension.  Currently on lisinopril hydrochlorothiazide combo.  Tolerating this medicine without any problems.  No chest pain, shortness of breath, palpitations or lower extremity swelling.  Also would like refill on her GERD medication.  States omeprazole works well to control acid reflux symptoms.  Also tries to avoid excess acid and or spicy type foods.    Patient Active Problem List   Diagnosis Date Noted  . Dysphagia 02/15/2019  . Essential hypertension 02/12/2018  . GERD without esophagitis 02/12/2018  . Posterior rhinorrhea 02/12/2018  . Arthritis of right knee 05/14/2015  . Pes anserine bursitis 05/14/2015  . Patellofemoral pain syndrome 05/14/2015  . Allergic rhinitis 03/27/2015  . Duodenitis 03/27/2015  . Can't get food down 03/27/2015  . Atypical chest pain 03/06/2015  . Chest pain, atypical 03/06/2015  . Family history of colonic polyps 12/28/2013  . Family history of polyps in the colon 12/28/2013   Social History   Tobacco Use  . Smoking status: Former Games developer  . Smokeless tobacco: Never Used  Substance Use Topics  . Alcohol use: No    Alcohol/week: 0.0 standard drinks   Review of Systems  Constitutional: Negative for chills, fatigue and fever.  HENT: Negative for congestion, ear pain, sinus pain and sore throat.   Eyes: Negative.   Respiratory: Negative for cough, shortness of breath and wheezing.   Cardiovascular:  Negative for chest pain, palpitations and leg swelling.  Gastrointestinal: Negative for abdominal pain, diarrhea, nausea and vomiting.  Genitourinary: Negative for dysuria, frequency and urgency.  Musculoskeletal: Negative for arthralgias and myalgias.  Skin: Negative for color change, pallor and rash.  Neurological: Negative for syncope, light-headedness and headaches.  Psychiatric/Behavioral: The patient is not nervous/anxious.       Objective:   Physical Exam Vitals signs and nursing note reviewed.  Constitutional:      General: She is not in acute distress.    Appearance: She is not ill-appearing, toxic-appearing or diaphoretic.  HENT:     Head: Normocephalic and atraumatic.  Eyes:     General: No scleral icterus.    Extraocular Movements: Extraocular movements intact.     Conjunctiva/sclera: Conjunctivae normal.  Cardiovascular:     Rate and Rhythm: Normal rate and regular rhythm.     Heart sounds: Normal heart sounds.  Pulmonary:     Effort: Pulmonary effort is normal. No respiratory distress.     Breath sounds: Normal breath sounds.  Musculoskeletal:     Right lower leg: No edema.     Left lower leg: No edema.  Skin:    General: Skin is warm and dry.     Capillary Refill: Capillary refill takes less than 2 seconds.     Coloration: Skin is not jaundiced or pale.  Neurological:     Mental Status: She is alert and oriented to person, place, and time.  Gait: Gait normal.  Psychiatric:        Mood and Affect: Mood normal.    Today's Vitals   02/23/19 0908  BP: (!) 144/78  Pulse: 68  Temp: (!) 96.9 F (36.1 C)  TempSrc: Temporal  SpO2: 99%  Weight: 231 lb 6.4 oz (105 kg)  Height: 5\' 4"  (1.626 m)   Body mass index is 39.72 kg/m.     Assessment & Plan:    Mixed hyperlipidemia - Plan: rosuvastatin (CRESTOR) 10 MG tablet, Hemoglobin A1c  Essential hypertension - Plan: lisinopril-hydrochlorothiazide (ZESTORETIC) 10-12.5 MG tablet  GERD without esophagitis  - Plan: omeprazole (PRILOSEC) 20 MG capsule  After discussion of options, patient is agreeable to start Crestor once per day to better control cholesterol levels.  We will also check A1c due to elevated glucose and her screening as well as her history of high cholesterol and hypertension.  Patient will continue Zestoretic for BP control.  She will continue omeprazole for GERD control.  Reviewed healthy diet and regular physical activity.  Recommended balanced diet with lots of lean protein, vegetables and fruits, whole grains and good water intake.   Patient will follow-up in approximately 3 months for recheck on cholesterol and other chronic medical issues.  Had flu vaccine via work.

## 2019-04-20 ENCOUNTER — Telehealth: Payer: Self-pay | Admitting: Internal Medicine

## 2019-04-20 NOTE — Telephone Encounter (Signed)
Noted thanks for this  Will discuss with pt where she wants to go in the future appt 05/27/19   [place]

## 2019-04-20 NOTE — Telephone Encounter (Signed)
I received notification that Diane Stephenson was overdue mammogram.  In reviewing her chart, it appears she had mammogram at [facility] 02/2018.  She is scheduled to establish care with you.  Just FYI.    Diane Stephenson

## 2019-05-25 ENCOUNTER — Telehealth: Payer: Self-pay | Admitting: Internal Medicine

## 2019-05-25 NOTE — Telephone Encounter (Signed)
I called pt twice and left vm to call ofc. °

## 2019-05-27 ENCOUNTER — Other Ambulatory Visit: Payer: Self-pay

## 2019-05-27 ENCOUNTER — Encounter: Payer: Self-pay | Admitting: Internal Medicine

## 2019-05-27 ENCOUNTER — Ambulatory Visit (INDEPENDENT_AMBULATORY_CARE_PROVIDER_SITE_OTHER): Payer: Managed Care, Other (non HMO) | Admitting: Internal Medicine

## 2019-05-27 VITALS — BP 146/85 | Ht 64.0 in | Wt 206.0 lb

## 2019-05-27 DIAGNOSIS — R7303 Prediabetes: Secondary | ICD-10-CM

## 2019-05-27 DIAGNOSIS — Z1231 Encounter for screening mammogram for malignant neoplasm of breast: Secondary | ICD-10-CM

## 2019-05-27 DIAGNOSIS — I10 Essential (primary) hypertension: Secondary | ICD-10-CM | POA: Diagnosis not present

## 2019-05-27 DIAGNOSIS — E785 Hyperlipidemia, unspecified: Secondary | ICD-10-CM

## 2019-05-27 DIAGNOSIS — E559 Vitamin D deficiency, unspecified: Secondary | ICD-10-CM | POA: Diagnosis not present

## 2019-05-27 DIAGNOSIS — Z13818 Encounter for screening for other digestive system disorders: Secondary | ICD-10-CM

## 2019-05-27 DIAGNOSIS — Z1329 Encounter for screening for other suspected endocrine disorder: Secondary | ICD-10-CM

## 2019-05-27 DIAGNOSIS — Z1389 Encounter for screening for other disorder: Secondary | ICD-10-CM

## 2019-05-27 NOTE — Patient Instructions (Addendum)
Goal blood pressure <130/<80 if not send a message to me or call the clinic and we will think about norvasc 2.5 mg daily   Please call [facility] and schedule your mammogram   Prediabetes Eating Plan Prediabetes is a condition that causes blood sugar (glucose) levels to be higher than normal. This increases the risk for developing diabetes. In order to prevent diabetes from developing, your health care provider may recommend a diet and other lifestyle changes to help you:  Control your blood glucose levels.  Improve your cholesterol levels.  Manage your blood pressure. Your health care provider may recommend working with a diet and nutrition specialist (dietitian) to make a meal plan that is best for you. What are tips for following this plan? Lifestyle  Set weight loss goals with the help of your health care team. It is recommended that most people with prediabetes lose 7% of their current body weight.  Exercise for at least 30 minutes at least 5 days a week.  Attend a support group or seek ongoing support from a mental health counselor.  Take over-the-counter and prescription medicines only as told by your health care provider. Reading food labels  Read food labels to check the amount of fat, salt (sodium), and sugar in prepackaged foods. Avoid foods that have: ? Saturated fats. ? Trans fats. ? Added sugars.  Avoid foods that have more than 300 milligrams (mg) of sodium per serving. Limit your daily sodium intake to less than 2,300 mg each day. Shopping  Avoid buying pre-made and processed foods. Cooking  Cook with olive oil. Do not use butter, lard, or ghee.  Bake, broil, grill, or boil foods. Avoid frying. Meal planning   Work with your dietitian to develop an eating plan that is right for you. This may include: ? Tracking how many calories you take in. Use a food diary, notebook, or mobile application to track what you eat at each meal. ? Using the glycemic index (GI)  to plan your meals. The index tells you how quickly a food will raise your blood glucose. Choose low-GI foods. These foods take a longer time to raise blood glucose.  Consider following a Mediterranean diet. This diet includes: ? Several servings each day of fresh fruits and vegetables. ? Eating fish at least twice a week. ? Several servings each day of whole grains, beans, nuts, and seeds. ? Using olive oil instead of other fats. ? Moderate alcohol consumption. ? Eating small amounts of red meat and whole-fat dairy.  If you have high blood pressure, you may need to limit your sodium intake or follow a diet such as the DASH eating plan. DASH is an eating plan that aims to lower high blood pressure. What foods are recommended? The items listed below may not be a complete list. Talk with your dietitian about what dietary choices are best for you. Grains Whole grains, such as whole-wheat or whole-grain breads, crackers, cereals, and pasta. Unsweetened oatmeal. Bulgur. Barley. Quinoa. Brown rice. Corn or whole-wheat flour tortillas or taco shells. Vegetables Lettuce. Spinach. Peas. Beets. Cauliflower. Cabbage. Broccoli. Carrots. Tomatoes. Squash. Eggplant. Herbs. Peppers. Onions. Cucumbers. Brussels sprouts. Fruits Berries. Bananas. Apples. Oranges. Grapes. Papaya. Mango. Pomegranate. Kiwi. Grapefruit. Cherries. Meats and other protein foods Seafood. Poultry without skin. Lean cuts of pork and beef. Tofu. Eggs. Nuts. Beans. Dairy Low-fat or fat-free dairy products, such as yogurt, cottage cheese, and cheese. Beverages Water. Tea. Coffee. Sugar-free or diet soda. Seltzer water. Lowfat or no-fat milk. Milk alternatives,  such as soy or almond milk. Fats and oils Olive oil. Canola oil. Sunflower oil. Grapeseed oil. Avocado. Walnuts. Sweets and desserts Sugar-free or low-fat pudding. Sugar-free or low-fat ice cream and other frozen treats. Seasoning and other foods Herbs. Sodium-free spices.  Mustard. Relish. Low-fat, low-sugar ketchup. Low-fat, low-sugar barbecue sauce. Low-fat or fat-free mayonnaise. What foods are not recommended? The items listed below may not be a complete list. Talk with your dietitian about what dietary choices are best for you. Grains Refined white flour and flour products, such as bread, pasta, snack foods, and cereals. Vegetables Canned vegetables. Frozen vegetables with butter or cream sauce. Fruits Fruits canned with syrup. Meats and other protein foods Fatty cuts of meat. Poultry with skin. Breaded or fried meat. Processed meats. Dairy Full-fat yogurt, cheese, or milk. Beverages Sweetened drinks, such as sweet iced tea and soda. Fats and oils Butter. Lard. Ghee. Sweets and desserts Baked goods, such as cake, cupcakes, pastries, cookies, and cheesecake. Seasoning and other foods Spice mixes with added salt. Ketchup. Barbecue sauce. Mayonnaise. Summary  To prevent diabetes from developing, you may need to make diet and other lifestyle changes to help control blood sugar, improve cholesterol levels, and manage your blood pressure.  Set weight loss goals with the help of your health care team. It is recommended that most people with prediabetes lose 7 percent of their current body weight.  Consider following a Mediterranean diet that includes plenty of fresh fruits and vegetables, whole grains, beans, nuts, seeds, fish, lean meat, low-fat dairy, and healthy oils. This information is not intended to replace advice given to you by your health care provider. Make sure you discuss any questions you have with your health care provider. Document Revised: 08/06/2018 Document Reviewed: 06/18/2016 Elsevier Patient Education  2020 [employer]  High Cholesterol  High cholesterol is a condition in which the blood has high levels of a white, waxy, fat-like substance (cholesterol). The human body needs small amounts of cholesterol. The liver makes all the  cholesterol that the body needs. Extra (excess) cholesterol comes from the food that we eat. Cholesterol is carried from the liver by the blood through the blood vessels. If you have high cholesterol, deposits (plaques) may build up on the walls of your blood vessels (arteries). Plaques make the arteries narrower and stiffer. Cholesterol plaques increase your risk for heart attack and stroke. Work with your health care provider to keep your cholesterol levels in a healthy range. What increases the risk? This condition is more likely to develop in people who:  Eat foods that are high in animal fat (saturated fat) or cholesterol.  Are overweight.  Are not getting enough exercise.  Have a family history of high cholesterol. What are the signs or symptoms? There are no symptoms of this condition. How is this diagnosed? This condition may be diagnosed from the results of a blood test.  If you are older than age 34, your health care provider may check your cholesterol every 4-6 years.  You may be checked more often if you already have high cholesterol or other risk factors for heart disease. The blood test for cholesterol measures:  "Bad" cholesterol (LDL cholesterol). This is the main type of cholesterol that causes heart disease. The desired level for LDL is less than 100.  "Good" cholesterol (HDL cholesterol). This type helps to protect against heart disease by cleaning the arteries and carrying the LDL away. The desired level for HDL is 60 or higher.  Triglycerides. These are  fats that the body can store or burn for energy. The desired number for triglycerides is lower than 150.  Total cholesterol. This is a measure of the total amount of cholesterol in your blood, including LDL cholesterol, HDL cholesterol, and triglycerides. A healthy number is less than 200. How is this treated? This condition is treated with diet changes, lifestyle changes, and medicines. Diet changes  This may  include eating more whole grains, fruits, vegetables, nuts, and fish.  This may also include cutting back on red meat and foods that have a lot of added sugar. Lifestyle changes  Changes may include getting at least 40 minutes of aerobic exercise 3 times a week. Aerobic exercises include walking, biking, and swimming. Aerobic exercise along with a healthy diet can help you maintain a healthy weight.  Changes may also include quitting smoking. Medicines  Medicines are usually given if diet and lifestyle changes have failed to reduce your cholesterol to healthy levels.  Your health care provider may prescribe a statin medicine. Statin medicines have been shown to reduce cholesterol, which can reduce the risk of heart disease. Follow these instructions at home: Eating and drinking If told by your health care provider:  Eat chicken (without skin), fish, veal, shellfish, ground [place] breast, and round or loin cuts of red meat.  Do not eat fried foods or fatty meats, such as hot dogs and salami.  Eat plenty of fruits, such as apples.  Eat plenty of vegetables, such as broccoli, potatoes, and carrots.  Eat beans, peas, and lentils.  Eat grains such as barley, rice, couscous, and bulgur wheat.  Eat pasta without cream sauces.  Use skim or nonfat milk, and eat low-fat or nonfat yogurt and cheeses.  Do not eat or drink whole milk, cream, ice cream, egg yolks, or hard cheeses.  Do not eat stick margarine or tub margarines that contain trans fats (also called partially hydrogenated oils).  Do not eat saturated tropical oils, such as coconut oil and palm oil.  Do not eat cakes, cookies, crackers, or other baked goods that contain trans fats.  General instructions  Exercise as directed by your health care provider. Increase your activity level with activities such as gardening, walking, and taking the stairs.  Take over-the-counter and prescription medicines only as told by your  health care provider.  Do not use any products that contain nicotine or tobacco, such as cigarettes and e-cigarettes. If you need help quitting, ask your health care provider.  Keep all follow-up visits as told by your health care provider. This is important. Contact a health care provider if:  You are struggling to maintain a healthy diet or weight.  You need help to start on an exercise program.  You need help to stop smoking. Get help right away if:  You have chest pain.  You have trouble breathing. This information is not intended to replace advice given to you by your health care provider. Make sure you discuss any questions you have with your health care provider. Document Revised: 04/17/2017 Document Reviewed: 10/13/2015 Elsevier Patient Education  2020 [employer].  Cholesterol Content in Foods Cholesterol is a waxy, fat-like substance that helps to carry fat in the blood. The body needs cholesterol in small amounts, but too much cholesterol can cause damage to the arteries and heart. Most people should eat less than 200 milligrams (mg) of cholesterol a day. Foods with cholesterol  Cholesterol is found in animal-based foods, such as meat, seafood, and dairy. Generally,  low-fat dairy and lean meats have less cholesterol than full-fat dairy and fatty meats. The milligrams of cholesterol per serving (mg per serving) of common cholesterol-containing foods are listed below. Meat and other proteins  Egg - one large whole egg has 186 mg.  Veal shank - 4 oz has 141 mg.  Lean ground [place] (93% lean) - 4 oz has 118 mg.  Fat-trimmed lamb loin - 4 oz has 106 mg.  Lean ground beef (90% lean) - 4 oz has 100 mg.  Lobster - 3.5 oz has 90 mg.  Pork loin chops - 4 oz has 86 mg.  Canned salmon - 3.5 oz has 83 mg.  Fat-trimmed beef top loin - 4 oz has 78 mg.  Frankfurter - 1 frank (3.5 oz) has 77 mg.  Crab - 3.5 oz has 71 mg.  Roasted chicken without skin, white meat - 4 oz  has 66 mg.  Light bologna - 2 oz has 45 mg.  Deli-cut [place] - 2 oz has 31 mg.  Canned tuna - 3.5 oz has 31 mg.  Bacon - 1 oz has 29 mg.  Oysters and mussels (raw) - 3.5 oz has 25 mg.  Mackerel - 1 oz has 22 mg.  Trout - 1 oz has 20 mg.  Pork sausage - 1 link (1 oz) has 17 mg.  Salmon - 1 oz has 16 mg.  Tilapia - 1 oz has 14 mg. Dairy  Soft-serve ice cream -  cup (4 oz) has 103 mg.  Whole-milk yogurt - 1 cup (8 oz) has 29 mg.  Cheddar cheese - 1 oz has 28 mg.  American cheese - 1 oz has 28 mg.  Whole milk - 1 cup (8 oz) has 23 mg.  2% milk - 1 cup (8 oz) has 18 mg.  Cream cheese - 1 tablespoon (Tbsp) has 15 mg.  Cottage cheese -  cup (4 oz) has 14 mg.  Low-fat (1%) milk - 1 cup (8 oz) has 10 mg.  Sour cream - 1 Tbsp has 8.5 mg.  Low-fat yogurt - 1 cup (8 oz) has 8 mg.  Nonfat Greek yogurt - 1 cup (8 oz) has 7 mg.  Half-and-half cream - 1 Tbsp has 5 mg. Fats and oils  Cod liver oil - 1 tablespoon (Tbsp) has 82 mg.  Butter - 1 Tbsp has 15 mg.  Lard - 1 Tbsp has 14 mg.  Bacon grease - 1 Tbsp has 14 mg.  Mayonnaise - 1 Tbsp has 5-10 mg.  Margarine - 1 Tbsp has 3-10 mg. Exact amounts of cholesterol in these foods may vary depending on specific ingredients and brands. Foods without cholesterol Most plant-based foods do not have cholesterol unless you combine them with a food that has cholesterol. Foods without cholesterol include:  Grains and cereals.  Vegetables.  Fruits.  Vegetable oils, such as olive, canola, and sunflower oil.  Legumes, such as peas, beans, and lentils.  Nuts and seeds.  Egg whites. Summary  The body needs cholesterol in small amounts, but too much cholesterol can cause damage to the arteries and heart.  Most people should eat less than 200 milligrams (mg) of cholesterol a day. This information is not intended to replace advice given to you by your health care provider. Make sure you discuss any questions you have  with your health care provider. Document Revised: 03/27/2017 Document Reviewed: 12/09/2016 Elsevier Patient Education  2020 [employer] Inc.  Hypertension, Adult High blood pressure (hypertension) is when the force  of blood pumping through the arteries is too strong. The arteries are the blood vessels that carry blood from the heart throughout the body. Hypertension forces the heart to work harder to pump blood and may cause arteries to become narrow or stiff. Untreated or uncontrolled hypertension can cause a heart attack, heart failure, a stroke, kidney disease, and other problems. A blood pressure reading consists of a higher number over a lower number. Ideally, your blood pressure should be below 120/80. The first ("top") number is called the systolic pressure. It is a measure of the pressure in your arteries as your heart beats. The second ("bottom") number is called the diastolic pressure. It is a measure of the pressure in your arteries as the heart relaxes. What are the causes? The exact cause of this condition is not known. There are some conditions that result in or are related to high blood pressure. What increases the risk? Some risk factors for high blood pressure are under your control. The following factors may make you more likely to develop this condition:  Smoking.  Having type 2 diabetes mellitus, high cholesterol, or both.  Not getting enough exercise or physical activity.  Being overweight.  Having too much fat, sugar, calories, or salt (sodium) in your diet.  Drinking too much alcohol. Some risk factors for high blood pressure may be difficult or impossible to change. Some of these factors include:  Having chronic kidney disease.  Having a family history of high blood pressure.  Age. Risk increases with age.  Race. You may be at higher risk if you are African American.  Gender. Men are at higher risk than women before age 57. After age 30, women are at higher risk  than men.  Having obstructive sleep apnea.  Stress. What are the signs or symptoms? High blood pressure may not cause symptoms. Very high blood pressure (hypertensive crisis) may cause:  Headache.  Anxiety.  Shortness of breath.  Nosebleed.  Nausea and vomiting.  Vision changes.  Severe chest pain.  Seizures. How is this diagnosed? This condition is diagnosed by measuring your blood pressure while you are seated, with your arm resting on a flat surface, your legs uncrossed, and your feet flat on the floor. The cuff of the blood pressure monitor will be placed directly against the skin of your upper arm at the level of your heart. It should be measured at least twice using the same arm. Certain conditions can cause a difference in blood pressure between your right and left arms. Certain factors can cause blood pressure readings to be lower or higher than normal for a short period of time:  When your blood pressure is higher when you are in a health care provider's office than when you are at home, this is called white coat hypertension. Most people with this condition do not need medicines.  When your blood pressure is higher at home than when you are in a health care provider's office, this is called masked hypertension. Most people with this condition may need medicines to control blood pressure. If you have a high blood pressure reading during one visit or you have normal blood pressure with other risk factors, you may be asked to:  Return on a different day to have your blood pressure checked again.  Monitor your blood pressure at home for 1 week or longer. If you are diagnosed with hypertension, you may have other blood or imaging tests to help your health care provider understand your  overall risk for other conditions. How is this treated? This condition is treated by making healthy lifestyle changes, such as eating healthy foods, exercising more, and reducing your alcohol  intake. Your health care provider may prescribe medicine if lifestyle changes are not enough to get your blood pressure under control, and if:  Your systolic blood pressure is above 130.  Your diastolic blood pressure is above 80. Your personal target blood pressure may vary depending on your medical conditions, your age, and other factors. Follow these instructions at home: Eating and drinking   Eat a diet that is high in fiber and potassium, and low in sodium, added sugar, and fat. An example eating plan is called the DASH (Dietary Approaches to Stop Hypertension) diet. To eat this way: ? Eat plenty of fresh fruits and vegetables. Try to fill one half of your plate at each meal with fruits and vegetables. ? Eat whole grains, such as whole-wheat pasta, brown rice, or whole-grain bread. Fill about one fourth of your plate with whole grains. ? Eat or drink low-fat dairy products, such as skim milk or low-fat yogurt. ? Avoid fatty cuts of meat, processed or cured meats, and poultry with skin. Fill about one fourth of your plate with lean proteins, such as fish, chicken without skin, beans, eggs, or tofu. ? Avoid pre-made and processed foods. These tend to be higher in sodium, added sugar, and fat.  Reduce your daily sodium intake. Most people with hypertension should eat less than 1,500 mg of sodium a day.  Do not drink alcohol if: ? Your health care provider tells you not to drink. ? You are pregnant, may be pregnant, or are planning to become pregnant.  If you drink alcohol: ? Limit how much you use to:  0-1 drink a day for women.  0-2 drinks a day for men. ? Be aware of how much alcohol is in your drink. In the [place] one drink equals one 12 oz bottle of beer (355 mL), one 5 oz glass of wine (148 mL), or one 1 oz glass of hard liquor (44 mL). Lifestyle   Work with your health care provider to maintain a healthy body weight or to lose weight. Ask what an ideal weight is for you.   Get at least 30 minutes of exercise most days of the week. Activities may include walking, swimming, or biking.  Include exercise to strengthen your muscles (resistance exercise), such as Pilates or lifting weights, as part of your weekly exercise routine. Try to do these types of exercises for 30 minutes at least 3 days a week.  Do not use any products that contain nicotine or tobacco, such as cigarettes, e-cigarettes, and chewing tobacco. If you need help quitting, ask your health care provider.  Monitor your blood pressure at home as told by your health care provider.  Keep all follow-up visits as told by your health care provider. This is important. Medicines  Take over-the-counter and prescription medicines only as told by your health care provider. Follow directions carefully. Blood pressure medicines must be taken as prescribed.  Do not skip doses of blood pressure medicine. Doing this puts you at risk for problems and can make the medicine less effective.  Ask your health care provider about side effects or reactions to medicines that you should watch for. Contact a health care provider if you:  Think you are having a reaction to a medicine you are taking.  Have headaches that keep coming back (recurring).  Feel dizzy.  Have swelling in your ankles.  Have trouble with your vision. Get help right away if you:  Develop a severe headache or confusion.  Have unusual weakness or numbness.  Feel faint.  Have severe pain in your chest or abdomen.  Vomit repeatedly.  Have trouble breathing. Summary  Hypertension is when the force of blood pumping through your arteries is too strong. If this condition is not controlled, it may put you at risk for serious complications.  Your personal target blood pressure may vary depending on your medical conditions, your age, and other factors. For most people, a normal blood pressure is less than 120/80.  Hypertension is treated with  lifestyle changes, medicines, or a combination of both. Lifestyle changes include losing weight, eating a healthy, low-sodium diet, exercising more, and limiting alcohol. This information is not intended to replace advice given to you by your health care provider. Make sure you discuss any questions you have with your health care provider. Document Revised: 12/23/2017 Document Reviewed: 12/23/2017 Elsevier Patient Education  2020 Else[employer] Inc.  Low-Sodium Eating Plan Sodium, which is an element that makes up salt, helps you maintain a healthy balance of fluids in your body. Too much sodium can increase your blood pressure and cause fluid and waste to be held in your body. Your health care provider or dietitian may recommend following this plan if you have high blood pressure (hypertension), kidney disease, liver disease, or heart failure. Eating less sodium can help lower your blood pressure, reduce swelling, and protect your heart, liver, and kidneys. What are tips for following this plan? General guidelines  Most people on this plan should limit their sodium intake to 1,500-2,000 mg (milligrams) of sodium each day. Reading food labels   The Nutrition Facts label lists the amount of sodium in one serving of the food. If you eat more than one serving, you must multiply the listed amount of sodium by the number of servings.  Choose foods with less than 140 mg of sodium per serving.  Avoid foods with 300 mg of sodium or more per serving. Shopping  Look for lower-sodium products, often labeled as "low-sodium" or "no salt added."  Always check the sodium content even if foods are labeled as "unsalted" or "no salt added".  Buy fresh foods. ? Avoid canned foods and premade or frozen meals. ? Avoid canned, cured, or processed meats  Buy breads that have less than 80 mg of sodium per slice. Cooking  Eat more home-cooked food and less restaurant, buffet, and fast food.  Avoid adding salt  when cooking. Use salt-free seasonings or herbs instead of table salt or sea salt. Check with your health care provider or pharmacist before using salt substitutes.  Cook with plant-based oils, such as canola, sunflower, or olive oil. Meal planning  When eating at a restaurant, ask that your food be prepared with less salt or no salt, if possible.  Avoid foods that contain MSG (monosodium glutamate). MSG is sometimes added to [place] food, bouillon, and some canned foods. What foods are recommended? The items listed may not be a complete list. Talk with your dietitian about what dietary choices are best for you. Grains Low-sodium cereals, including oats, puffed wheat and rice, and shredded wheat. Low-sodium crackers. Unsalted rice. Unsalted pasta. Low-sodium bread. Whole-grain breads and whole-grain pasta. Vegetables Fresh or frozen vegetables. "No salt added" canned vegetables. "No salt added" tomato sauce and paste. Low-sodium or reduced-sodium tomato and vegetable juice. Fruits Fresh, frozen,  or canned fruit. Fruit juice. Meats and other protein foods Fresh or frozen (no salt added) meat, poultry, seafood, and fish. Low-sodium canned tuna and salmon. Unsalted nuts. Dried peas, beans, and lentils without added salt. Unsalted canned beans. Eggs. Unsalted nut butters. Dairy Milk. Soy milk. Cheese that is naturally low in sodium, such as ricotta cheese, fresh mozzarella, or Swiss cheese Low-sodium or reduced-sodium cheese. Cream cheese. Yogurt. Fats and oils Unsalted butter. Unsalted margarine with no trans fat. Vegetable oils such as canola or olive oils. Seasonings and other foods Fresh and dried herbs and spices. Salt-free seasonings. Low-sodium mustard and ketchup. Sodium-free salad dressing. Sodium-free light mayonnaise. Fresh or refrigerated horseradish. Lemon juice. Vinegar. Homemade, reduced-sodium, or low-sodium soups. Unsalted popcorn and pretzels. Low-salt or salt-free chips. What  foods are not recommended? The items listed may not be a complete list. Talk with your dietitian about what dietary choices are best for you. Grains Instant hot cereals. Bread stuffing, pancake, and biscuit mixes. Croutons. Seasoned rice or pasta mixes. Noodle soup cups. Boxed or frozen macaroni and cheese. Regular salted crackers. Self-rising flour. Vegetables Sauerkraut, pickled vegetables, and relishes. Olives. [place] fries. Onion rings. Regular canned vegetables (not low-sodium or reduced-sodium). Regular canned tomato sauce and paste (not low-sodium or reduced-sodium). Regular tomato and vegetable juice (not low-sodium or reduced-sodium). Frozen vegetables in sauces. Meats and other protein foods Meat or fish that is salted, canned, smoked, spiced, or pickled. Bacon, ham, sausage, hotdogs, corned beef, chipped beef, packaged lunch meats, salt pork, jerky, pickled herring, anchovies, regular canned tuna, sardines, salted nuts. Dairy Processed cheese and cheese spreads. Cheese curds. Blue cheese. Feta cheese. String cheese. Regular cottage cheese. Buttermilk. Canned milk. Fats and oils Salted butter. Regular margarine. Ghee. Bacon fat. Seasonings and other foods Onion salt, garlic salt, seasoned salt, table salt, and sea salt. Canned and packaged gravies. Worcestershire sauce. Tartar sauce. Barbecue sauce. Teriyaki sauce. Soy sauce, including reduced-sodium. Steak sauce. Fish sauce. Oyster sauce. Cocktail sauce. Horseradish that you find on the shelf. Regular ketchup and mustard. Meat flavorings and tenderizers. Bouillon cubes. Hot sauce and Tabasco sauce. Premade or packaged marinades. Premade or packaged taco seasonings. Relishes. Regular salad dressings. Salsa. Potato and tortilla chips. Corn chips and puffs. Salted popcorn and pretzels. Canned or dried soups. Pizza. Frozen entrees and pot pies. Summary  Eating less sodium can help lower your blood pressure, reduce swelling, and protect your  heart, liver, and kidneys.  Most people on this plan should limit their sodium intake to 1,500-2,000 mg (milligrams) of sodium each day.  Canned, boxed, and frozen foods are high in sodium. Restaurant foods, fast foods, and pizza are also very high in sodium. You also get sodium by adding salt to food.  Try to cook at home, eat more fresh fruits and vegetables, and eat less fast food, canned, processed, or prepared foods. This information is not intended to replace advice given to you by your health care provider. Make sure you discuss any questions you have with your health care provider. Document Revised: 03/27/2017 Document Reviewed: 04/07/2016 Elsevier Patient Education  2020 Elsevier Inc.  DASH Eating Plan DASH stands for "Dietary Approaches to Stop Hypertension." The DASH eating plan is a healthy eating plan that has been shown to reduce high blood pressure (hypertension). It may also reduce your risk for type 2 diabetes, heart disease, and stroke. The DASH eating plan may also help with weight loss. What are tips for following this plan?  General guidelines  Avoid eating more than  2,300 mg (milligrams) of salt (sodium) a day. If you have hypertension, you may need to reduce your sodium intake to 1,500 mg a day.  Limit alcohol intake to no more than 1 drink a day for nonpregnant women and 2 drinks a day for men. One drink equals 12 oz of beer, 5 oz of wine, or 1 oz of hard liquor.  Work with your health care provider to maintain a healthy body weight or to lose weight. Ask what an ideal weight is for you.  Get at least 30 minutes of exercise that causes your heart to beat faster (aerobic exercise) most days of the week. Activities may include walking, swimming, or biking.  Work with your health care provider or diet and nutrition specialist (dietitian) to adjust your eating plan to your individual calorie needs. Reading food labels   Check food labels for the amount of sodium per  serving. Choose foods with less than 5 percent of the Daily Value of sodium. Generally, foods with less than 300 mg of sodium per serving fit into this eating plan.  To find whole grains, look for the word "whole" as the first word in the ingredient list. Shopping  Buy products labeled as "low-sodium" or "no salt added."  Buy fresh foods. Avoid canned foods and premade or frozen meals. Cooking  Avoid adding salt when cooking. Use salt-free seasonings or herbs instead of table salt or sea salt. Check with your health care provider or pharmacist before using salt substitutes.  Do not fry foods. Cook foods using healthy methods such as baking, boiling, grilling, and broiling instead.  Cook with heart-healthy oils, such as olive, canola, soybean, or sunflower oil. Meal planning  Eat a balanced diet that includes: ? 5 or more servings of fruits and vegetables each day. At each meal, try to fill half of your plate with fruits and vegetables. ? Up to 6-8 servings of whole grains each day. ? Less than 6 oz of lean meat, poultry, or fish each day. A 3-oz serving of meat is about the same size as a deck of cards. One egg equals 1 oz. ? 2 servings of low-fat dairy each day. ? A serving of nuts, seeds, or beans 5 times each week. ? Heart-healthy fats. Healthy fats called Omega-3 fatty acids are found in foods such as flaxseeds and coldwater fish, like sardines, salmon, and mackerel.  Limit how much you eat of the following: ? Canned or prepackaged foods. ? Food that is high in trans fat, such as fried foods. ? Food that is high in saturated fat, such as fatty meat. ? Sweets, desserts, sugary drinks, and other foods with added sugar. ? Full-fat dairy products.  Do not salt foods before eating.  Try to eat at least 2 vegetarian meals each week.  Eat more home-cooked food and less restaurant, buffet, and fast food.  When eating at a restaurant, ask that your food be prepared with less salt or  no salt, if possible. What foods are recommended? The items listed may not be a complete list. Talk with your dietitian about what dietary choices are best for you. Grains Whole-grain or whole-wheat bread. Whole-grain or whole-wheat pasta. Brown rice. [place] Cobbatmeal. Quinoa. Bulgur. Whole-grain and low-sodium cereals. Pita bread. Low-fat, low-sodium crackers. Whole-wheat flour tortillas. Vegetables Fresh or frozen vegetables (raw, steamed, roasted, or grilled). Low-sodium or reduced-sodium tomato and vegetable juice. Low-sodium or reduced-sodium tomato sauce and tomato paste. Low-sodium or reduced-sodium canned vegetables. Fruits All fresh, dried, or  frozen fruit. Canned fruit in natural juice (without added sugar). Meat and other protein foods Skinless chicken or [place]. Ground chicken or [place]. Pork with fat trimmed off. Fish and seafood. Egg whites. Dried beans, peas, or lentils. Unsalted nuts, nut butters, and seeds. Unsalted canned beans. Lean cuts of beef with fat trimmed off. Low-sodium, lean deli meat. Dairy Low-fat (1%) or fat-free (skim) milk. Fat-free, low-fat, or reduced-fat cheeses. Nonfat, low-sodium ricotta or cottage cheese. Low-fat or nonfat yogurt. Low-fat, low-sodium cheese. Fats and oils Soft margarine without trans fats. Vegetable oil. Low-fat, reduced-fat, or light mayonnaise and salad dressings (reduced-sodium). Canola, safflower, olive, soybean, and sunflower oils. Avocado. Seasoning and other foods Herbs. Spices. Seasoning mixes without salt. Unsalted popcorn and pretzels. Fat-free sweets. What foods are not recommended? The items listed may not be a complete list. Talk with your dietitian about what dietary choices are best for you. Grains Baked goods made with fat, such as croissants, muffins, or some breads. Dry pasta or rice meal packs. Vegetables Creamed or fried vegetables. Vegetables in a cheese sauce. Regular canned vegetables (not low-sodium or reduced-sodium).  Regular canned tomato sauce and paste (not low-sodium or reduced-sodium). Regular tomato and vegetable juice (not low-sodium or reduced-sodium). Rosita Fire. Olives. Fruits Canned fruit in a light or heavy syrup. Fried fruit. Fruit in cream or butter sauce. Meat and other protein foods Fatty cuts of meat. Ribs. Fried meat. Tomasa Blase. Sausage. Bologna and other processed lunch meats. Salami. Fatback. Hotdogs. Bratwurst. Salted nuts and seeds. Canned beans with added salt. Canned or smoked fish. Whole eggs or egg yolks. Chicken or [place] with skin. Dairy Whole or 2% milk, cream, and half-and-half. Whole or full-fat cream cheese. Whole-fat or sweetened yogurt. Full-fat cheese. Nondairy creamers. Whipped toppings. Processed cheese and cheese spreads. Fats and oils Butter. Stick margarine. Lard. Shortening. Ghee. Bacon fat. Tropical oils, such as coconut, palm kernel, or palm oil. Seasoning and other foods Salted popcorn and pretzels. Onion salt, garlic salt, seasoned salt, table salt, and sea salt. Worcestershire sauce. Tartar sauce. Barbecue sauce. Teriyaki sauce. Soy sauce, including reduced-sodium. Steak sauce. Canned and packaged gravies. Fish sauce. Oyster sauce. Cocktail sauce. Horseradish that you find on the shelf. Ketchup. Mustard. Meat flavorings and tenderizers. Bouillon cubes. Hot sauce and Tabasco sauce. Premade or packaged marinades. Premade or packaged taco seasonings. Relishes. Regular salad dressings. Where to find more information:  National Heart, Lung, and Blood Institute: PopSteam.is  American Heart Association: www.heart.org Summary  The DASH eating plan is a healthy eating plan that has been shown to reduce high blood pressure (hypertension). It may also reduce your risk for type 2 diabetes, heart disease, and stroke.  With the DASH eating plan, you should limit salt (sodium) intake to 2,300 mg a day. If you have hypertension, you may need to reduce your sodium intake to 1,500 mg  a day.  When on the DASH eating plan, aim to eat more fresh fruits and vegetables, whole grains, lean proteins, low-fat dairy, and heart-healthy fats.  Work with your health care provider or diet and nutrition specialist (dietitian) to adjust your eating plan to your individual calorie needs. This information is not intended to replace advice given to you by your health care provider. Make sure you discuss any questions you have with your health care provider. Document Revised: 03/27/2017 Document Reviewed: 04/07/2016 Elsevier Patient Education  2020 [employer].

## 2019-05-27 NOTE — Progress Notes (Signed)
Virtual Visit via Video Note  I connected with Diane Stephenson  on 05/27/19 at  3:20 PM EST by a video enabled telemedicine application and verified that I am speaking with the correct person using two identifiers.  Location patient: home Location provider:work or home office Persons participating in the virtual visit: patient, provider, pts husband   I discussed the limitations of evaluation and management by telemedicine and the availability of in person appointments. The patient expressed understanding and agreed to proceed.   HPI: 1. htn bp 135/91 repeat 146/85 on lis/hctz 10-12.5 mg not controlled  2. HLd on crestor 10 mg qd need to repeat lipid panel  3. Prediabetes A1c 6.2   ROS: See pertinent positives and negatives per HPI. CV: no chest pain  Lungs no sob    Past Medical History:  Diagnosis Date  . Diverticulitis   . GERD (gastroesophageal reflux disease)   . Hyperlipidemia   . Hypertension     Past Surgical History:  Procedure Laterality Date  . ABDOMINAL HYSTERECTOMY    . NASAL SEPTUM SURGERY  2001  . throat surgery      Family History  Problem Relation Age of Onset  . Thyroid disease Mother   . Dementia Mother   . Heart disease Father        cabg x 4   . Hyperlipidemia Father   . Heart attack Father   . Arthritis Father   . Alcohol abuse Maternal Grandmother   . Cancer Paternal Grandmother        breast  . Heart attack Paternal Grandmother   . Stroke Paternal Grandmother   . Diabetes Paternal Grandmother   . Diabetes Brother   . 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  Cholecalciferol (VITAMIN D) 50 MCG (2000 UT) tablet, Take 2,000 Units by mouth daily., Disp: , Rfl:  .  fluticasone (FLONASE) 50 MCG/ACT nasal spray, Place 1 spray into both nostrils daily., Disp: 16 g, Rfl: 5 .  lisinopril-hydrochlorothiazide (ZESTORETIC) 10-12.5 MG  tablet, Take 1 tablet by mouth daily., Disp: 90 tablet, Rfl: 3 .  loratadine (CLARITIN) 10 MG tablet, Take by mouth., Disp: , Rfl:  .  Multiple Vitamin (MULTI-VITAMINS) TABS, Take by mouth., Disp: , Rfl:  .  Omega-3 Fatty Acids (FISH OIL OMEGA-3 PO), Take by mouth daily., Disp: , Rfl:  .  omeprazole (PRILOSEC) 20 MG capsule, Take 1 capsule (20 mg total) by mouth daily., Disp: 90 capsule, Rfl: 3 .  potassium gluconate 595 (99 K) MG TABS tablet, Take by mouth., Disp: , Rfl:  .  rosuvastatin (CRESTOR) 10 MG tablet, Take 1 tablet (10 mg total) by mouth daily., Disp: 90 tablet, Rfl: 3 .  Turmeric (QC TUMERIC COMPLEX) 500 MG CAPS, Take by mouth daily., Disp: , Rfl:   EXAM:  VITALS per patient if applicable:  GENERAL: alert, oriented, appears well and in no acute distress  HEENT: atraumatic, conjunttiva clear, no obvious abnormalities on inspection of external nose and ears  NECK: normal movements of the head and neck  LUNGS: on inspection no signs of respiratory distress, breathing rate appears normal, no obvious gross SOB, gasping or wheezing  CV: no obvious cyanosis  MS: moves all visible extremities without noticeable abnormality  PSYCH/NEURO: pleasant and cooperative, no obvious depression or anxiety, speech and thought processing grossly intact  ASSESSMENT AND PLAN:  Discussed the following assessment and plan:  Essential hypertension -  Fasting labs if BP not controlled will need to add norvasc 2.5 mg qd  Goal BP <130/<80   Hyperlipidemia, unspecified hyperlipidemia type - Plan: Lipid panel rec healthy diet and exercise  Cont meds   Prediabetes - Plan: Hemoglobin A1c Given diet info and rec exercise   Vitamin D deficiency - Plan: Vitamin D (25 hydroxy)  HM Flu utd tdap consider in future  shingrix consider  covid 19 vx pt declines for now   Pap hys total fibroids/scar tissue both ovaries out; [facility]  -no h/o abnormal pap  mammo 03/03/18 ordered [facility] to call to  schedule  Colonoscopy [facility] due pt wants to wait polyps, diverticulosis/itis history and is due as of 05/27/19 wants to wait  Skin no issues 05/27/19  rec D3 4000 Iu qd rec increase from 2000 IU    -we discussed possible serious and likely etiologies, options for evaluation and workup, limitations of telemedicine visit vs in person visit, treatment, treatment risks and precautions. Pt prefers to treat via telemedicine empirically rather then risking or undertaking an in person visit at this moment. Patient agrees to seek prompt in person care if worsening, new symptoms arise, or if is not improving with treatment.   I discussed the assessment and treatment plan with the patient. The patient was provided an opportunity to ask questions and all were answered. The patient agreed with the plan and demonstrated an understanding of the instructions.   The patient was advised to call back or seek an in-person evaluation if the symptoms worsen or if the condition fails to improve as anticipated.  Time spent 30 minutes  Delorise Jackson, MD

## 2019-06-22 ENCOUNTER — Other Ambulatory Visit: Payer: Managed Care, Other (non HMO)

## 2019-06-22 ENCOUNTER — Other Ambulatory Visit: Payer: Self-pay

## 2019-06-22 DIAGNOSIS — I10 Essential (primary) hypertension: Secondary | ICD-10-CM | POA: Diagnosis not present

## 2019-06-22 DIAGNOSIS — E559 Vitamin D deficiency, unspecified: Secondary | ICD-10-CM | POA: Diagnosis not present

## 2019-06-22 DIAGNOSIS — R7303 Prediabetes: Secondary | ICD-10-CM

## 2019-06-22 DIAGNOSIS — E785 Hyperlipidemia, unspecified: Secondary | ICD-10-CM

## 2019-06-23 LAB — LIPID PANEL
Chol/HDL Ratio: 4.2 ratio (ref 0.0–4.4)
Cholesterol, Total: 131 mg/dL (ref 100–199)
HDL: 31 mg/dL — ABNORMAL LOW (ref >39)
LDL Chol Calc (NIH): 85 mg/dL (ref 0–99)
Triglycerides: 76 mg/dL (ref 0–149)
VLDL Cholesterol Cal: 15 mg/dL (ref 5–40)

## 2019-06-23 LAB — COMPREHENSIVE METABOLIC PANEL
ALT: 14 IU/L (ref 0–32)
AST: 14 IU/L (ref 0–40)
Albumin/Globulin Ratio: 1.6 (ref 1.2–2.2)
Albumin: 3.9 g/dL (ref 3.8–4.9)
Alkaline Phosphatase: 90 IU/L (ref 39–117)
BUN/Creatinine Ratio: 13 (ref 9–23)
BUN: 10 mg/dL (ref 6–24)
Bilirubin Total: <0.2 mg/dL (ref 0.0–1.2)
CO2: 24 mmol/L (ref 20–29)
Calcium: 9.3 mg/dL (ref 8.7–10.2)
Chloride: 102 mmol/L (ref 96–106)
Creatinine, Ser: 0.75 mg/dL (ref 0.57–1.00)
GFR calc Af Amer: 102 mL/min/{1.73_m2} (ref >59)
GFR calc non Af Amer: 89 mL/min/{1.73_m2} (ref >59)
Globulin, Total: 2.4 g/dL (ref 1.5–4.5)
Glucose: 118 mg/dL — ABNORMAL HIGH (ref 65–99)
Potassium: 4.3 mmol/L (ref 3.5–5.2)
Sodium: 139 mmol/L (ref 134–144)
Total Protein: 6.3 g/dL (ref 6.0–8.5)

## 2019-06-23 LAB — URINALYSIS, ROUTINE W REFLEX MICROSCOPIC
Bilirubin, UA: NEGATIVE
Glucose, UA: NEGATIVE
Ketones, UA: NEGATIVE
Leukocytes,UA: NEGATIVE
Nitrite, UA: NEGATIVE
Protein,UA: NEGATIVE
RBC, UA: NEGATIVE
Specific Gravity, UA: 1.009 (ref 1.005–1.030)
Urobilinogen, Ur: 0.2 mg/dL (ref 0.2–1.0)
pH, UA: 5.5 (ref 5.0–7.5)

## 2019-06-23 LAB — CBC WITH DIFFERENTIAL/PLATELET
Basophils Absolute: 0.1 10*3/uL (ref 0.0–0.2)
Basos: 1 %
EOS (ABSOLUTE): 0.2 10*3/uL (ref 0.0–0.4)
Eos: 3 %
Hematocrit: 37.7 % (ref 34.0–46.6)
Hemoglobin: 12.3 g/dL (ref 11.1–15.9)
Immature Grans (Abs): 0 10*3/uL (ref 0.0–0.1)
Immature Granulocytes: 0 %
Lymphocytes Absolute: 1.7 10*3/uL (ref 0.7–3.1)
Lymphs: 30 %
MCH: 28.3 pg (ref 26.6–33.0)
MCHC: 32.6 g/dL (ref 31.5–35.7)
MCV: 87 fL (ref 79–97)
Monocytes Absolute: 0.4 10*3/uL (ref 0.1–0.9)
Monocytes: 7 %
Neutrophils Absolute: 3.4 10*3/uL (ref 1.4–7.0)
Neutrophils: 59 %
Platelets: 295 10*3/uL (ref 150–450)
RBC: 4.35 x10E6/uL (ref 3.77–5.28)
RDW: 12.2 % (ref 11.7–15.4)
WBC: 5.8 10*3/uL (ref 3.4–10.8)

## 2019-06-23 LAB — TSH: TSH: 0.538 u[IU]/mL (ref 0.450–4.500)

## 2019-06-23 LAB — VITAMIN D 25 HYDROXY (VIT D DEFICIENCY, FRACTURES): Vit D, 25-Hydroxy: 53.7 ng/mL (ref 30.0–100.0)

## 2019-06-23 LAB — HGB A1C W/O EAG: Hgb A1c MFr Bld: 5.7 % — ABNORMAL HIGH (ref 4.8–5.6)

## 2019-06-23 LAB — HCV COMMENT:

## 2019-06-23 LAB — HEPATITIS C ANTIBODY (REFLEX): HCV Ab: <0.1 s/co ratio (ref 0.0–0.9)

## 2019-09-01 ENCOUNTER — Ambulatory Visit: Payer: Managed Care, Other (non HMO) | Admitting: Internal Medicine

## 2020-02-27 ENCOUNTER — Other Ambulatory Visit: Payer: Self-pay | Admitting: Internal Medicine

## 2020-02-27 DIAGNOSIS — E782 Mixed hyperlipidemia: Secondary | ICD-10-CM

## 2020-02-27 MED ORDER — ROSUVASTATIN CALCIUM 10 MG PO TABS: 10.0000 mg | ORAL_TABLET | Freq: Every day | ORAL | 0 refills | Status: DC

## 2020-06-10 ENCOUNTER — Other Ambulatory Visit: Payer: Self-pay | Admitting: Internal Medicine

## 2020-06-10 DIAGNOSIS — E782 Mixed hyperlipidemia: Secondary | ICD-10-CM

## 2020-06-15 ENCOUNTER — Telehealth: Payer: Self-pay | Admitting: Internal Medicine

## 2020-06-15 DIAGNOSIS — I10 Essential (primary) hypertension: Secondary | ICD-10-CM

## 2020-06-15 MED ORDER — LISINOPRIL-HYDROCHLOROTHIAZIDE 10-12.5 MG PO TABS: 1.0000 | ORAL_TABLET | Freq: Every day | ORAL | 3 refills | Status: DC

## 2020-06-15 NOTE — Telephone Encounter (Signed)
Pt needs a refill on lisinopril-hydrochlorothiazide (ZESTORETIC) 10-12.5 MG tablet sent to [facility]

## 2020-07-16 ENCOUNTER — Ambulatory Visit
Admission: EM | Admit: 2020-07-16 | Discharge: 2020-07-16 | Disposition: A | Discharge status: Home / Self Care | Payer: Managed Care, Other (non HMO) | Attending: Family Medicine | Admitting: Family Medicine

## 2020-07-16 ENCOUNTER — Other Ambulatory Visit: Payer: Self-pay

## 2020-07-16 ENCOUNTER — Ambulatory Visit (INDEPENDENT_AMBULATORY_CARE_PROVIDER_SITE_OTHER): Payer: Managed Care, Other (non HMO)

## 2020-07-16 DIAGNOSIS — M25522 Pain in left elbow: Secondary | ICD-10-CM

## 2020-07-16 MED ORDER — PREDNISONE 10 MG PO TABS: ORAL_TABLET | ORAL | 0 refills | Status: DC

## 2020-07-16 NOTE — Discharge Instructions (Signed)
Rest, ice, elevation.  Medication as prescribed.  Please call [facility] clinic Orthopedics [phone]) OR [facility] [phone]) for an appt if this does not improve or worsens.  Take care  Dr. Adriana Simas

## 2020-07-16 NOTE — ED Triage Notes (Signed)
Patient states that she has been having left arm pain for over 1 week. States that the swelling just started today, with some discoloration and heat to area.

## 2020-07-17 NOTE — ED Provider Notes (Signed)
[facility]    CSN: [id number] Arrival date & time: 07/16/20  1800      History   Chief Complaint Chief Complaint  Patient presents with  . Arm Pain    left   HPI   60 year old female presents with left arm pain.  1 week history of left arm pain.  Localizes the pain predominantly to the left elbow.  No recent fall, trauma, injury.  She reports some associated redness and warmth to the area.  Worse at night and when she lies on it.  No known relieving factors.  Pain 7/10 in severity.  No other associated symptoms.  No other complaints.  Past Medical History:  Diagnosis Date  . Diverticulitis   . GERD (gastroesophageal reflux disease)   . Hyperlipidemia   . Hypertension     Patient Active Problem List   Diagnosis Date Noted  . Prediabetes 05/27/2019  . Vitamin D deficiency 05/27/2019  . Hyperlipidemia 05/27/2019  . Dysphagia 02/15/2019  . Essential hypertension 02/12/2018  . GERD without esophagitis 02/12/2018  . Posterior rhinorrhea 02/12/2018  . Arthritis of right knee 05/14/2015  . Pes anserine bursitis 05/14/2015  . Patellofemoral pain syndrome 05/14/2015  . Allergic rhinitis 03/27/2015  . Duodenitis 03/27/2015  . Can't get food down 03/27/2015  . Atypical chest pain 03/06/2015  . Chest pain, atypical 03/06/2015  . Family history of colonic polyps 12/28/2013  . Family history of polyps in the colon 12/28/2013    Past Surgical History:  Procedure Laterality Date  . ABDOMINAL HYSTERECTOMY    . NASAL SEPTUM SURGERY  2001  . throat surgery      OB History   No obstetric history on file.      Home Medications    Prior to Admission medications   Medication Sig Start Date End Date Taking? Authorizing Provider  Cholecalciferol (VITAMIN D) 50 MCG (2000 UT) tablet Take 4,000 Units by mouth daily.    Yes [provider]  fluticasone (FLONASE) 50 MCG/ACT nasal spray Place 1 spray into both nostrils daily. 02/12/18  Yes Guse, Janna Arch,  FNP  lisinopril-hydrochlorothiazide (ZESTORETIC) 10-12.5 MG tablet Take 1 tablet by mouth daily. 06/15/20  Yes McLean-Scocuzza, Pasty Spillers, MD  loratadine (CLARITIN) 10 MG tablet Take by mouth.   Yes [provider]  Multiple Vitamin (MULTI-VITAMINS) TABS Take by mouth.   Yes [provider]  Omega-3 Fatty Acids (FISH OIL OMEGA-3 PO) Take by mouth daily.   Yes [provider]  omeprazole (PRILOSEC) 20 MG capsule Take 1 capsule (20 mg total) by mouth daily. 02/23/19  Yes Guse, Janna Arch, FNP  predniSONE (DELTASONE) 10 MG tablet 50 mg daily x 2 days, then 40 mg daily x 2 days, then 30 mg daily x 2 days, then 20 mg daily x 2 days, then 10 mg daily x 2 days. 07/16/20  Yes Mahnoor Mathisen G, DO  potassium gluconate 595 (99 K) MG TABS tablet Take by mouth.  07/16/20  [provider]  rosuvastatin (CRESTOR) 10 MG tablet TAKE 1 TABLET BY MOUTH ONCE DAILY (PATIENT  NEEDS  OFFICE  VISIT AND LABS FOR FURTHER REFILLS) 06/11/20 07/16/20  McLean-Scocuzza, Pasty Spillers, MD    Family History Family History  Problem Relation Age of Onset  . Thyroid disease Mother   . Dementia Mother   . Heart disease Father        cabg x 4   . Hyperlipidemia Father   . Heart attack Father   . Arthritis  Father   . Alcohol abuse Maternal Grandmother   . Cancer Paternal Grandmother        breast  . Heart attack Paternal Grandmother   . Stroke Paternal Grandmother   . Diabetes Paternal Grandmother   . Diabetes Brother   . Cancer Maternal Aunt        breast ca 1/2 aunt     Social History Social History   Tobacco Use  . Smoking status: Former Games developer  . Smokeless tobacco: Never Used  Vaping Use  . Vaping Use: Never used  Substance Use Topics  . Alcohol use: No    Alcohol/week: 0.0 standard drinks  . Drug use: No     Allergies   Septra [sulfamethoxazole-trimethoprim]   Review of Systems Review of Systems  Constitutional: Negative.   Musculoskeletal:       Left elbow pain.   Physical  Exam Triage Vital Signs ED Triage Vitals  Enc Vitals Group     BP 07/16/20 1830 (!) 174/90     Pulse Rate 07/16/20 1830 72     Resp 07/16/20 1830 18     Temp 07/16/20 1830 98.3 F (36.8 C)     Temp Source 07/16/20 1830 Oral     SpO2 07/16/20 1830 100 %     Weight 07/16/20 1828 240 lb (108.9 kg)     Height 07/16/20 1828 5\' 4"  (1.626 m)     Head Circumference --      Peak Flow --      Pain Score 07/16/20 1828 7     Pain Loc --      Pain Edu? --      Excl. in GC? --    Updated Vital Signs BP (!) 174/90 (BP Location: Left Arm)   Pulse 72   Temp 98.3 F (36.8 C) (Oral)   Resp 18   Ht 5\' 4"  (1.626 m)   Wt 108.9 kg   SpO2 100%   BMI 41.20 kg/m   Visual Acuity Right Eye Distance:   Left Eye Distance:   Bilateral Distance:    Right Eye Near:   Left Eye Near:    Bilateral Near:     Physical Exam Vitals and nursing note reviewed.  Constitutional:      General: She is not in acute distress.    Appearance: Normal appearance. She is not ill-appearing.  HENT:     Head: Normocephalic and atraumatic.  Eyes:     General:        Right eye: No discharge.        Left eye: No discharge.     Conjunctiva/sclera: Conjunctivae normal.  Cardiovascular:     Rate and Rhythm: Normal rate and regular rhythm.  Pulmonary:     Effort: Pulmonary effort is normal. No respiratory distress.     Breath sounds: Normal breath sounds.  Musculoskeletal:     Comments: Left elbow - No discrete areas of tenderness. No appreciable swelling. Diffuse erythema of the arm from the upper arm down.   Neurological:     Mental Status: She is alert.    UC Treatments / Results  Labs (all labs ordered are listed, but only abnormal results are displayed) Labs Reviewed - No data to display  EKG   Radiology DG Elbow Complete Left  Result Date: 07/16/2020 CLINICAL DATA:  Pain radiates to forearm.  Swelling. EXAM: LEFT ELBOW - COMPLETE 3+ VIEW COMPARISON:  None. FINDINGS: There is no evidence of  fracture, dislocation, or joint effusion.  There is no evidence of arthropathy or other focal bone abnormality. Soft tissues are unremarkable. IMPRESSION: Negative. Electronically Signed   By: Charlett Nose M.D.   On: 07/16/2020 19:22    Procedures Procedures (including critical care time)  Medications Ordered in UC Medications - No data to display  Initial Impression / Assessment and Plan / UC Course  I have reviewed the triage vital signs and the nursing notes.  Pertinent labs & imaging results that were available during my care of the patient were reviewed by me and considered in my medical decision making (see chart for details).    60 year old female presents with left elbow pain.  X-ray obtained today and independently reviewed by me.  Interpretation: Normal x-ray.  No clinical evidence of cellulitis.  Suspect inflammatory in nature.  Placed empirically on prednisone.  Advised rest, ice, and elevation.  Advised to follow-up with orthopedics if persists.  Final Clinical Impressions(s) / UC Diagnoses   Final diagnoses:  Left elbow pain     Discharge Instructions     Rest, ice, elevation.  Medication as prescribed.  Please call [facility] clinic Orthopedics [phone]) OR [facility] [phone]) for an appt if this does not improve or worsens.  Take care  Dr. Adriana Simas      ED Prescriptions    Medication Sig Dispense Auth. Provider   predniSONE (DELTASONE) 10 MG tablet 50 mg daily x 2 days, then 40 mg daily x 2 days, then 30 mg daily x 2 days, then 20 mg daily x 2 days, then 10 mg daily x 2 days. 30 tablet Tommie Sams, DO     PDMP not reviewed this encounter.   Tommie Sams, [place] 07/17/20 1650

## 2021-07-05 ENCOUNTER — Other Ambulatory Visit: Payer: Self-pay | Admitting: Physician Assistant

## 2021-07-05 DIAGNOSIS — Z1231 Encounter for screening mammogram for malignant neoplasm of breast: Secondary | ICD-10-CM

## 2021-08-16 ENCOUNTER — Ambulatory Visit
Admission: RE | Admit: 2021-08-16 | Discharge: 2021-08-16 | Disposition: A | Discharge status: Home / Self Care | Payer: Managed Care, Other (non HMO) | Source: Ambulatory Visit | Attending: Physician Assistant | Admitting: Physician Assistant

## 2021-08-16 DIAGNOSIS — Z1231 Encounter for screening mammogram for malignant neoplasm of breast: Secondary | ICD-10-CM | POA: Diagnosis present

## 2021-08-19 ENCOUNTER — Other Ambulatory Visit: Payer: Self-pay | Admitting: *Deleted

## 2021-08-19 ENCOUNTER — Inpatient Hospital Stay
Admission: RE | Admit: 2021-08-19 | Discharge: 2021-08-19 | Disposition: A | Discharge status: Home / Self Care | Payer: Self-pay | Source: Ambulatory Visit | Attending: *Deleted | Admitting: *Deleted

## 2021-08-19 DIAGNOSIS — Z1231 Encounter for screening mammogram for malignant neoplasm of breast: Secondary | ICD-10-CM

## 2021-11-15 ENCOUNTER — Ambulatory Visit
Admission: RE | Admit: 2021-11-15 | Discharge: 2021-11-15 | Disposition: A | Discharge status: Home / Self Care | Payer: Managed Care, Other (non HMO) | Source: Ambulatory Visit | Attending: Registered Nurse | Admitting: Registered Nurse

## 2021-11-15 ENCOUNTER — Ambulatory Visit
Admission: RE | Admit: 2021-11-15 | Discharge: 2021-11-15 | Disposition: A | Discharge status: Home / Self Care | Payer: Managed Care, Other (non HMO) | Attending: Registered Nurse | Admitting: Registered Nurse

## 2021-11-15 ENCOUNTER — Other Ambulatory Visit: Payer: Self-pay | Admitting: Registered Nurse

## 2021-11-15 DIAGNOSIS — R002 Palpitations: Secondary | ICD-10-CM | POA: Insufficient documentation

## 2021-11-15 DIAGNOSIS — R079 Chest pain, unspecified: Secondary | ICD-10-CM | POA: Insufficient documentation

## 2022-01-01 ENCOUNTER — Ambulatory Visit: Payer: Managed Care, Other (non HMO) | Admitting: Dermatology

## 2022-01-22 ENCOUNTER — Encounter: Payer: Self-pay | Admitting: Cardiovascular Disease

## 2022-01-22 ENCOUNTER — Ambulatory Visit: Payer: Managed Care, Other (non HMO) | Attending: Cardiovascular Disease | Admitting: Cardiovascular Disease

## 2022-01-22 VITALS — BP 130/82 | HR 80 | Ht 64.0 in | Wt 222.4 lb

## 2022-01-22 DIAGNOSIS — I10 Essential (primary) hypertension: Secondary | ICD-10-CM | POA: Diagnosis not present

## 2022-01-22 DIAGNOSIS — R0789 Other chest pain: Secondary | ICD-10-CM

## 2022-01-22 DIAGNOSIS — R0602 Shortness of breath: Secondary | ICD-10-CM

## 2022-01-22 DIAGNOSIS — E782 Mixed hyperlipidemia: Secondary | ICD-10-CM

## 2022-01-22 MED ORDER — NEXLIZET 180-10 MG PO TABS: 1.0000 | ORAL_TABLET | Freq: Every day | ORAL | 3 refills | Status: DC

## 2022-01-22 NOTE — Progress Notes (Signed)
Cardiology Office Note  Date:  01/22/2022   ID:  Ashiya, Kinkead Feb 20, 1961, MRN [id number]  PCP:  Jannette Spanner, PA-C   Chief Complaint  Patient presents with   New Patient (Initial Visit)    Ref by Aurelio Brash, FNP for shortness of breath, chest pain and palpitations. Medications reviewed by the patient verbally.     HPI:  Ms. Diane Stephenson is a 61 year old woman with past medical history of Hypertension Hyperlipidemia, untreated Prediabetes Who presents by referral from [facility] for consultation of shortness of breath palpitations chest pain at rest  Last seen by primary care January 2021 In our system, No recent lab work available No prior cardiac studies No recent EKG  [facility] is following  Home pressure 120 to 130/60-70 Feels well on losartan 50 daily with Lasix 20 daily On lisinopril HCTZ blood pressure was not well controlled, had shortness of breath with fluid retention, chest tightness from fluid On Lasix for fluid has resolved, chest tightness resolved Feels at her baseline on current medication regimen  Statin myalgias Prior cholesterol up to 230-270 when not on medication Father with CAD, CABG age 44s   PMH:   has a past medical history of Diverticulitis, GERD (gastroesophageal reflux disease), Hyperlipidemia, and Hypertension.  PSH:    Past Surgical History:  Procedure Laterality Date   ABDOMINAL HYSTERECTOMY     NASAL SEPTUM SURGERY  2001   throat surgery      Current Outpatient Medications  Medication Sig Dispense Refill   Bempedoic Acid-Ezetimibe (NEXLIZET) 180-10 MG TABS Take 1 tablet by mouth daily. 90 tablet 3   Cholecalciferol (VITAMIN D) 125 MCG (5000 UT) CAPS Take 5,000 Units by mouth daily.     fluticasone (FLONASE) 50 MCG/ACT nasal spray Place 1 spray into both nostrils daily. 16 g 5   furosemide (LASIX) 20 MG tablet Take 20 mg by mouth daily.     loratadine (CLARITIN)  10 MG tablet Take by mouth.     losartan (COZAAR) 50 MG tablet Take 50 mg by mouth daily.     metFORMIN (GLUCOPHAGE) 500 MG tablet Take 500 mg by mouth daily.     methimazole (TAPAZOLE) 5 MG tablet Take by mouth.     Omega-3 Fatty Acids (FISH OIL OMEGA-3 PO) Take by mouth daily.     omeprazole (PRILOSEC) 20 MG capsule Take 1 capsule (20 mg total) by mouth daily. 90 capsule 3   venlafaxine XR (EFFEXOR-XR) 75 MG 24 hr capsule Take 75 mg by mouth daily.     No current facility-administered medications for this visit.    Allergies:   Septra [sulfamethoxazole-trimethoprim]   Social History:  The patient  reports that she has quit smoking. She has never used smokeless tobacco. She reports that she does not drink alcohol and does not use drugs.   Family History:   family history includes Alcohol abuse in her maternal grandmother and paternal grandmother; Arthritis in her father; Cancer in her maternal aunt and paternal grandmother; Dementia in her mother; Diabetes in her brother and paternal grandmother; Heart attack in her father and paternal grandmother; Heart disease in her father; Hyperlipidemia in her father; Stroke in her paternal grandmother; Thyroid disease in her mother.    Review of Systems: Review of Systems  Constitutional: Negative.   HENT: Negative.    Respiratory: Negative.    Cardiovascular: Negative.   Gastrointestinal: Negative.   Musculoskeletal: Negative.   Neurological: Negative.   Psychiatric/Behavioral: Negative.  All other systems reviewed and are negative.   PHYSICAL EXAM: VS:  BP 130/82 (BP Location: Right Arm, Patient Position: Sitting, Cuff Size: Normal)   Pulse 80   Ht 5\' 4"  (1.626 m)   Wt 222 lb 6 oz (100.9 kg)   SpO2 99%   BMI 38.17 kg/m  , BMI Body mass index is 38.17 kg/m. GEN: Well nourished, well developed, in no acute distress HEENT: normal Neck: no JVD, carotid bruits, or masses Cardiac: RRR; no murmurs, rubs, or gallops,no edema   Respiratory:  clear to auscultation bilaterally, normal work of breathing GI: soft, nontender, nondistended, + BS MS: no deformity or atrophy Skin: warm and dry, no rash Neuro:  Strength and sensation are intact Psych: euthymic mood, full affect  Recent Labs: No results found for requested labs within last 365 days.    Lipid Panel Lab Results  Component Value Date   CHOL 131 06/22/2019   HDL 31 (L) 06/22/2019   LDLCALC 85 06/22/2019   TRIG 76 06/22/2019      Wt Readings from Last 3 Encounters:  01/22/22 222 lb 6 oz (100.9 kg)  07/16/20 240 lb (108.9 kg)  05/27/19 206 lb (93.4 kg)     ASSESSMENT AND PLAN:  Problem List Items Addressed This Visit       Cardiology Problems   Hyperlipidemia   Relevant Medications   losartan (COZAAR) 50 MG tablet   furosemide (LASIX) 20 MG tablet   Bempedoic Acid-Ezetimibe (NEXLIZET) 180-10 MG TABS   Essential hypertension   Relevant Medications   losartan (COZAAR) 50 MG tablet   furosemide (LASIX) 20 MG tablet   Bempedoic Acid-Ezetimibe (NEXLIZET) 180-10 MG TABS   Other Visit Diagnoses     Shortness of breath    -  Primary   Chest tightness          Essential hypertension Blood pressure is well controlled on today's visit. No changes made to the medications.  Shortness of breath/chest tightness Symptoms of chest tightness likely from fluid retention, pulmonary edema High fluid intake daily Feels better on Lasix 20 daily, Better relief than when she was taking HCTZ Recommend if symptoms come back echocardiogram could be performed  Hyperlipidemia Prefers no statin Recommended she start combo pill bempedoic acid with Zetia (Nexlizet )180/10 mg daily/ This should drop cholesterol roughly 38%, possibly more We can do repeat lab work or this could be done through primary care  Family history cardiac disease Discussed various screening studies available such as calcium scoring For worsening chest discomfort cardiac CTA  could be ordered as well as echocardiogram    Total encounter time more than 50 minutes  Greater than 50% was spent in counseling and coordination of care with the patient    Signed, Esmond Plants, M.D., Ph.D. [place], [place]

## 2022-01-22 NOTE — Patient Instructions (Addendum)
Medication Instructions:  Please start nexlizet one a day  If you need a refill on your cardiac medications before your next appointment, please call your pharmacy.   Lab work: No new labs needed  Testing/Procedures: No new testing needed  Follow-Up: At [facility], you and your health needs are our priority.  As part of our continuing mission to provide you with exceptional heart care, we have created designated Provider Care Teams.  These Care Teams include your primary Cardiologist (physician) and Advanced Practice Providers (APPs -  Physician Assistants and Nurse Practitioners) who all work together to provide you with the care you need, when you need it.  You will need a follow up appointment as needed  Providers on your designated Care Team:   Murray Hodgkins, NP Christell Faith, PA-C Cadence Kathlen Mody, [place]  COVID-19 Vaccine Information can be found at: [email or web address] For questions related to vaccine distribution or appointments, please email [email or web address] or call [phone].

## 2022-01-24 NOTE — Addendum Note (Signed)
Addended by: Anselm Pancoast on: 01/24/2022 03:02 PM   Modules accepted: Orders

## 2022-02-04 ENCOUNTER — Telehealth: Payer: Self-pay

## 2022-02-04 NOTE — Telephone Encounter (Signed)
Received a fax from [employer] stating that the request to cover Nexlizet 180 mg-10 mg tablet has been denied due to the information that was submitted not meeting criteria necessary to approve the medication.

## 2022-02-19 ENCOUNTER — Telehealth: Payer: Self-pay | Admitting: Cardiovascular Disease

## 2022-02-19 MED ORDER — NEXLIZET 180-10 MG PO TABS: 1.0000 | ORAL_TABLET | Freq: Every day | ORAL | 3 refills | Status: DC

## 2022-02-19 NOTE — Telephone Encounter (Signed)
Pt c/o medication issue:  1. Name of Medication: nexlizet   2. How are you currently taking this medication (dosage and times per day)? 1 tablet daily  3. Are you having a reaction (difficulty breathing--STAT)? no  4. What is your medication issue? Patient's husband states the patient was given samples of the medication and told if she could tolerate it the office would send a prescription in for her. He states she is tolerating it and requests the prescription be sent in to [facility] on [address].

## 2022-02-19 NOTE — Telephone Encounter (Signed)
Pts husband says that she is doing well on the Nexlizet.. I advised him that I will send in to the Pharmacy for her and to let us know if the cost is uncomfortable and they need any further assistance.

## 2022-02-20 ENCOUNTER — Telehealth: Payer: Self-pay | Admitting: Cardiovascular Disease

## 2022-02-20 NOTE — Telephone Encounter (Signed)
Pt c/o medication issue:  1. Name of Medication: Bempedoic Acid-Ezetimibe (NEXLIZET) 180-10 MG TABS  2. How are you currently taking this medication (dosage and times per day)? Take 1 tablet by mouth daily.  3. Are you having a reaction (difficulty breathing--STAT)? no  4. What is your medication issue? Medication is too expensive. Please advise

## 2022-02-20 NOTE — Telephone Encounter (Signed)
Dr. Rockey Situ- please advise.  It looks like she has Risk manager.

## 2022-02-24 NOTE — Telephone Encounter (Signed)
Left VM for Shanon Brow to callback. Pt still works and has Pharmacist, community. Pt should be able to use co-pay card.  [email or web address]

## 2022-02-26 NOTE — Telephone Encounter (Signed)
I called and spoke with the patient's husband, Shanon Brow (ok per [facility]). I inquired if the patient is still currently working and have Pharmacist, community.  Per Shanon Brow, the patient does have commercial insurance. I have advised him that she may qualify for the $10 copay card for commercially insured patients.  I inquired if they have access to a computer, then they can access the co-pay card online, otherwise, I have a card in the office they can pick up.  Per Shanon Brow, computer access is limited, but he will be glad to come to the office to pick up the co-pay card for [place].  He is aware this will be at the front desk with in an envelope with the patient's name on it.  Shanon Brow voices understanding and is agreeable.

## 2022-03-30 IMAGING — CR DG ELBOW COMPLETE 3+V*L*
4 series · 5 of 5 positions shown · non-contrast
Comparison: None.

CLINICAL DATA: Pain radiates to forearm.  Swelling.

EXAM:
LEFT ELBOW - COMPLETE 3+ VIEW

[elbow ap]
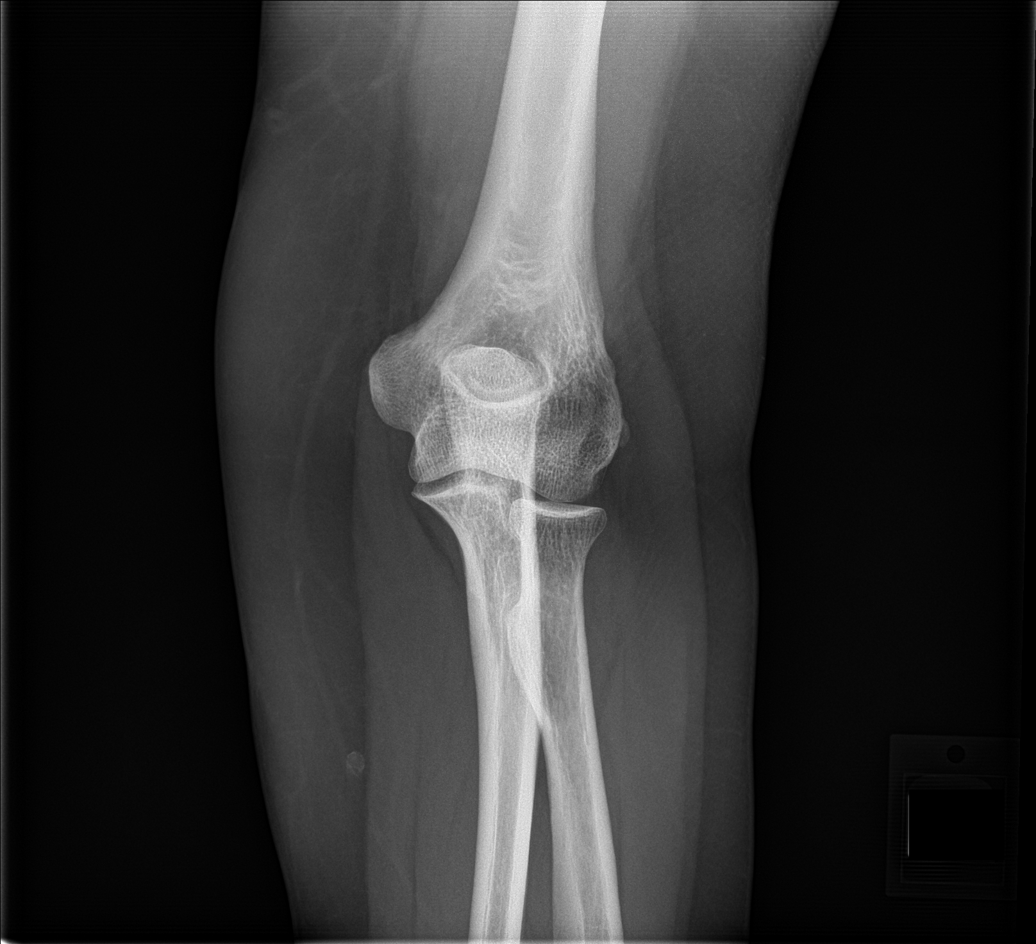

[elbow obl (1 of 2)]
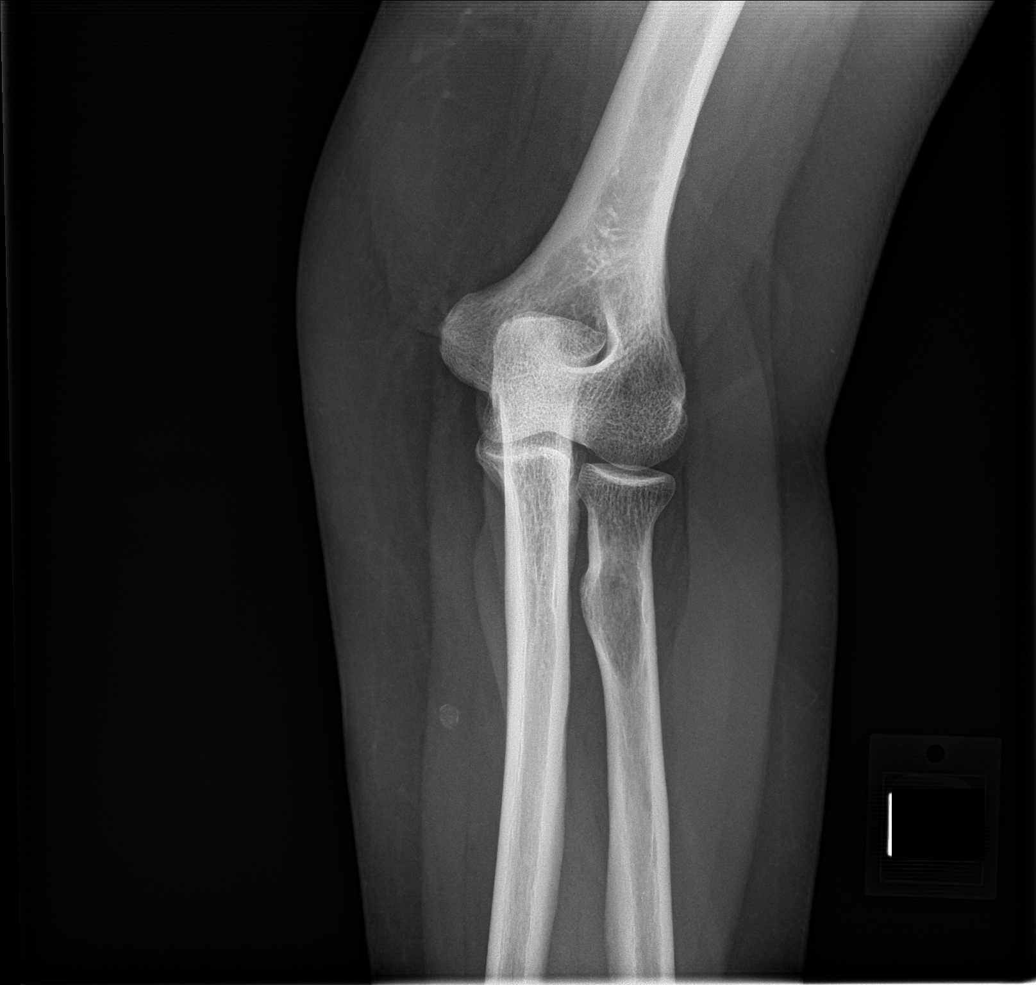

[elbow obl (2 of 2)]
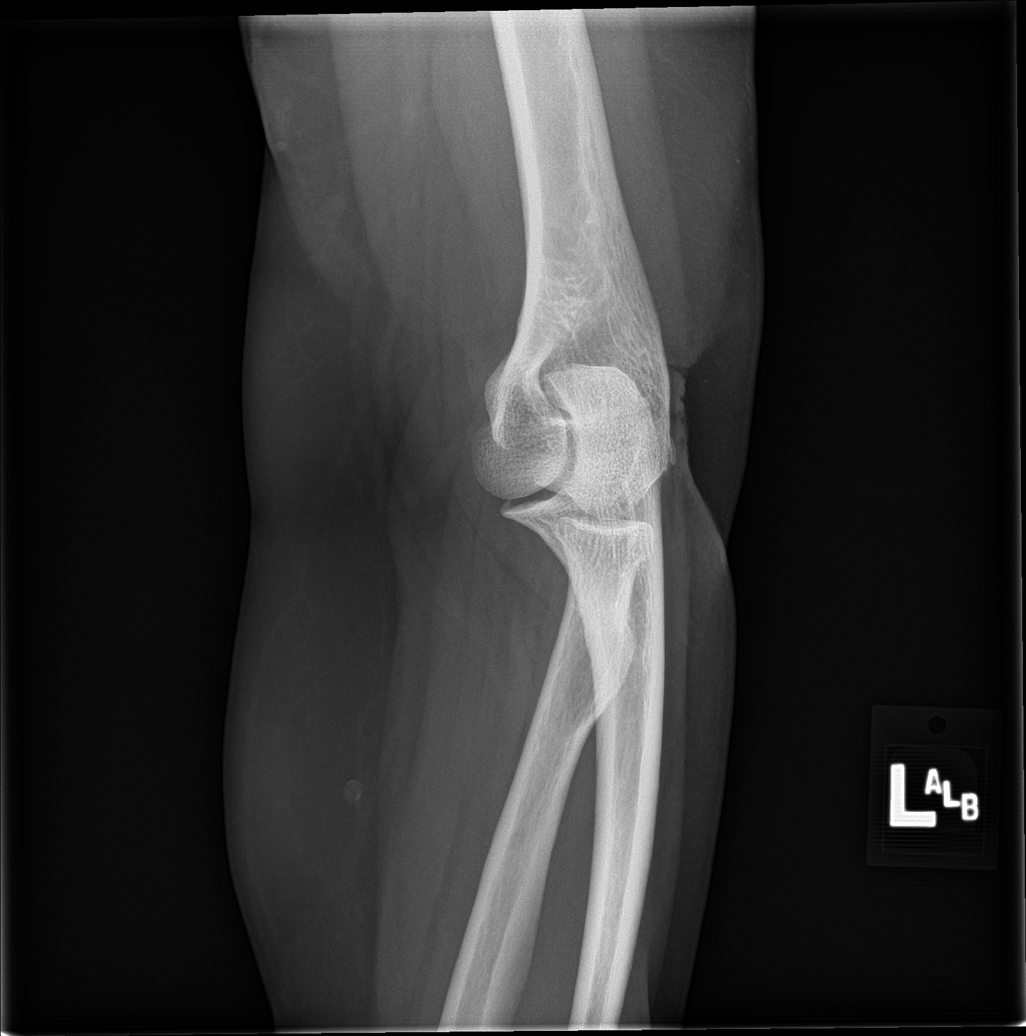

[Series 4: elbow lat · 0.14mm/px · 2 of 2 slices shown]
[im 1/2]
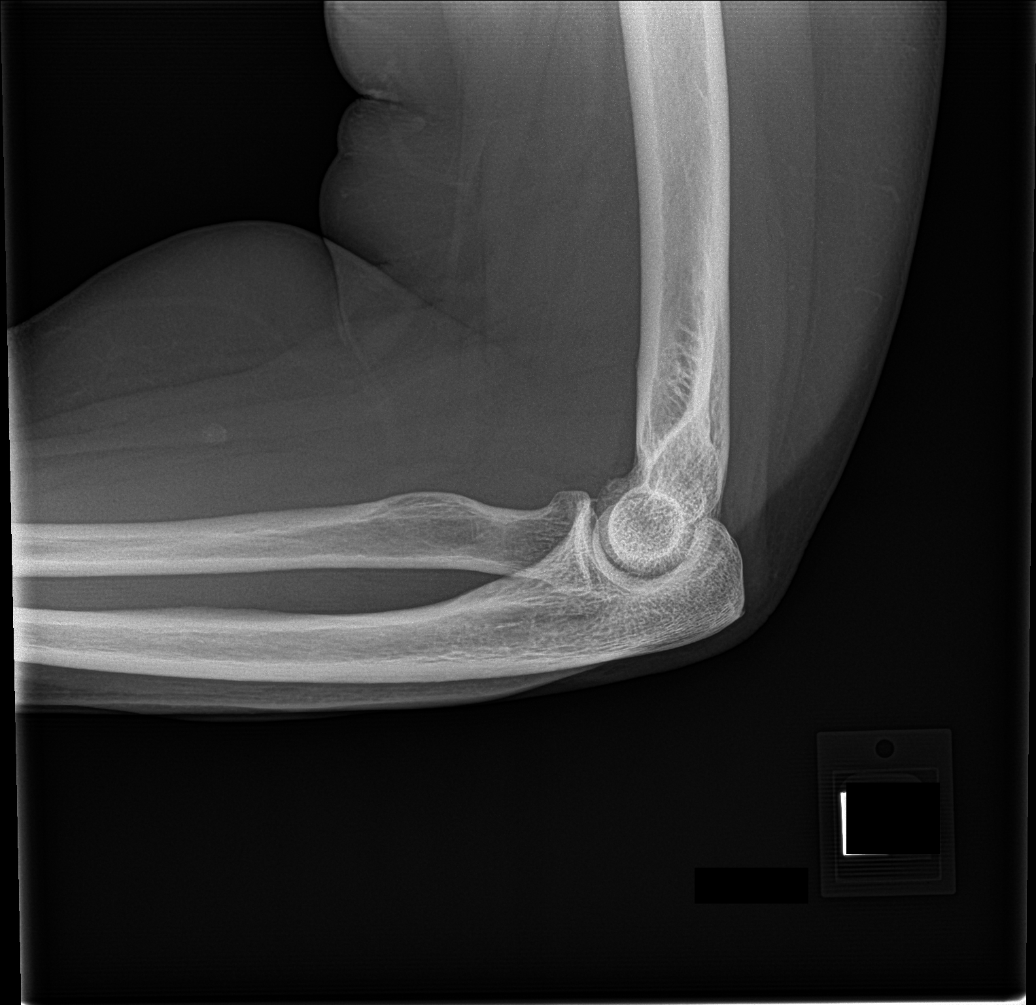
[im 2/2]
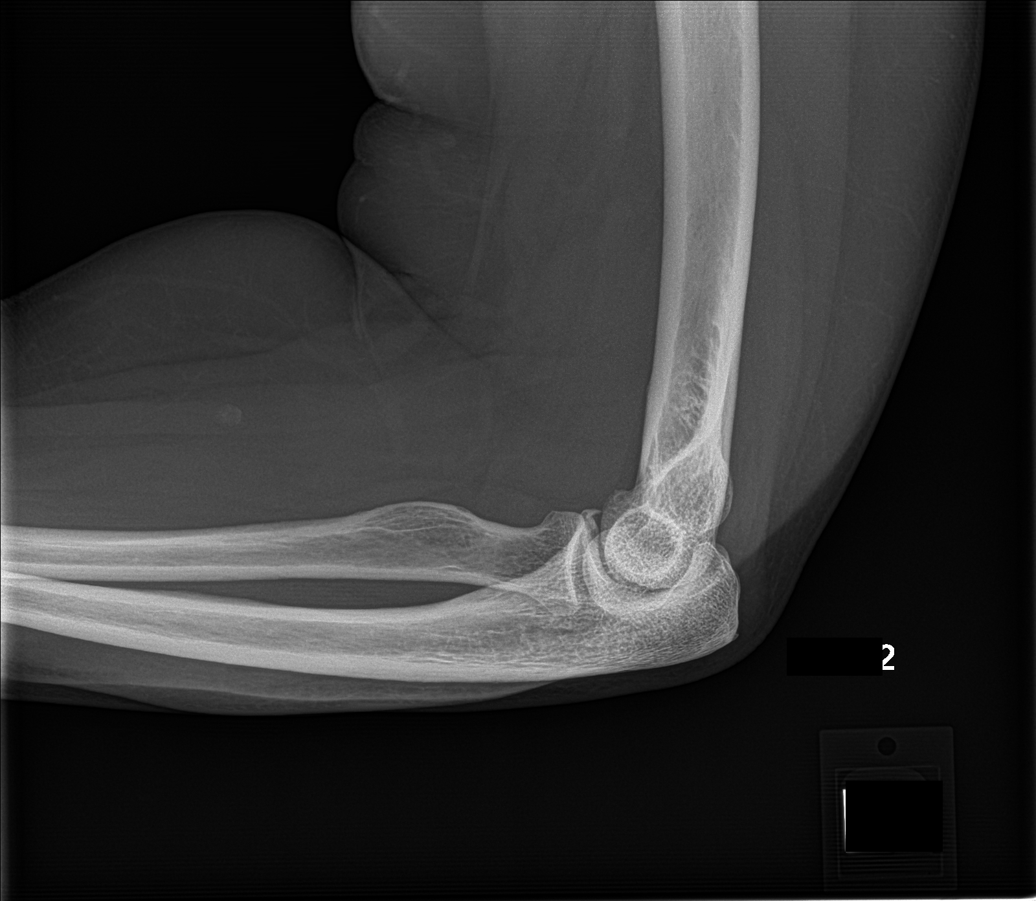

[5 of 5 positions shown; findings below may reference images not displayed]

FINDINGS: There is no evidence of fracture, dislocation, or joint effusion.
There is no evidence of arthropathy or other focal bone abnormality.
Soft tissues are unremarkable.
IMPRESSION: Negative.

## 2022-10-22 ENCOUNTER — Ambulatory Visit: Payer: Managed Care, Other (non HMO)

## 2022-10-22 ENCOUNTER — Ambulatory Visit
Admission: EM | Admit: 2022-10-22 | Discharge: 2022-10-22 | Disposition: A | Discharge status: Home / Self Care | Payer: Managed Care, Other (non HMO) | Attending: Family Medicine | Admitting: Family Medicine

## 2022-10-22 DIAGNOSIS — M25512 Pain in left shoulder: Secondary | ICD-10-CM

## 2022-10-22 DIAGNOSIS — R55 Syncope and collapse: Secondary | ICD-10-CM | POA: Diagnosis not present

## 2022-10-22 DIAGNOSIS — W19XXXA Unspecified fall, initial encounter: Secondary | ICD-10-CM | POA: Diagnosis not present

## 2022-10-22 LAB — GLUCOSE, CAPILLARY: Glucose-Capillary: 126 mg/dL — ABNORMAL HIGH (ref 70–99)

## 2022-10-22 MED ORDER — ONDANSETRON HCL 4 MG/2ML IJ SOLN
4.0000 mg | Freq: Once | INTRAMUSCULAR | Status: AC
  Administered 2022-10-22: 4 mg via INTRAMUSCULAR

## 2022-10-22 MED ORDER — KETOROLAC TROMETHAMINE 60 MG/2ML IM SOLN
30.0000 mg | Freq: Once | INTRAMUSCULAR | Status: AC
  Administered 2022-10-22: 30 mg via INTRAMUSCULAR

## 2022-10-22 MED ORDER — HYDROCODONE-ACETAMINOPHEN 5-325 MG PO TABS: 1.0000 | ORAL_TABLET | Freq: Four times a day (QID) | ORAL | 0 refills | Status: AC | PRN

## 2022-10-22 MED ORDER — NAPROXEN 500 MG PO TABS: 500.0000 mg | ORAL_TABLET | Freq: Two times a day (BID) | ORAL | 0 refills | Status: AC
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
## 2022-10-22 NOTE — ED Triage Notes (Signed)
Pt presents with facial bruising and lt leg abrasions. Pt states she was walking her dog and was pulled by it she fell and hit her face. Pt states she heard a pull to the Lt shoulder.

## 2022-10-22 NOTE — Discharge Instructions (Addendum)
If medication was prescribed, stop by the pharmacy to pick up your prescriptions.  For your  pain, Take 1000 mg Tylenol twice a day, take Naprosyn twice a day,  as needed for pain. Take your arm out of the sling several times a day to prevent shoulder lock-up.  Apply cold compresses intermittently, as needed.  As pain recedes, begin normal activities slowly as tolerated.  Follow up with an orthopedic provider, if symptoms persist.  Watch for worsening symptoms such as an increasing weakness or loss of sensation, increasing pain and/or the loss of bladder or bowel function. Should any of these occur, go to the emergency department immediately.

## 2023-07-30 ENCOUNTER — Encounter: Payer: Self-pay | Admitting: Family Medicine

## 2023-08-07 ENCOUNTER — Encounter: Payer: Self-pay | Admitting: Family Medicine

## 2023-08-31 ENCOUNTER — Encounter: Payer: Self-pay | Admitting: Family Medicine

## 2023-08-31 ENCOUNTER — Other Ambulatory Visit: Payer: Self-pay | Admitting: Family Medicine

## 2023-08-31 DIAGNOSIS — Z1231 Encounter for screening mammogram for malignant neoplasm of breast: Secondary | ICD-10-CM

## 2023-09-01 ENCOUNTER — Other Ambulatory Visit: Payer: Self-pay | Admitting: Family Medicine

## 2023-09-01 DIAGNOSIS — N63 Unspecified lump in unspecified breast: Secondary | ICD-10-CM

## 2023-09-01 DIAGNOSIS — N6320 Unspecified lump in the left breast, unspecified quadrant: Secondary | ICD-10-CM

## 2023-09-04 ENCOUNTER — Ambulatory Visit
Admission: RE | Admit: 2023-09-04 | Discharge: 2023-09-04 | Disposition: A | Discharge status: Home / Self Care | Source: Ambulatory Visit | Attending: Family Medicine | Admitting: Family Medicine

## 2023-09-04 DIAGNOSIS — N63 Unspecified lump in unspecified breast: Secondary | ICD-10-CM

## 2023-09-04 DIAGNOSIS — N6320 Unspecified lump in the left breast, unspecified quadrant: Secondary | ICD-10-CM | POA: Diagnosis present

## 2024-01-25 NOTE — Progress Notes (Unsigned)
 Cardiology Office Note  Date:  01/26/2024   ID:  Rafaella, Kole [birth year], MRN [id number]  PCP:  Bevelyn Kate NOVAK, PA-C   Chief Complaint  Patient presents with   Follow-up    Patient c/o chest tightness/indigestion and shortness of breath a couple of weeks ago.     HPI:  Ms. Giabella Duhart is a 63 year old woman with past medical history of Hypertension Hyperlipidemia, untreated Prediabetes Who presents for follow-up of her shortness of breath, palpitations chest pain at rest  LOV 9/23 Followed by [facility]   In follow-up today she reports having recent episodes of chest pain Several weeks ago severe pain woke her overnight, she did not tell her husband Tried various modalities from home, eventually symptoms subsided Reports stuttering chest pain since that time central chest concerning for angina  Risk factors include former smoker, diabetes, hyperlipidemia Also with strong family history  Blood pressure measurements from home reviewed typically 120 up to 130 systolic  Prior history of statin myalgias Though now reports she is able to tolerate Crestor  10 with Zetia 10 daily Total cholesterol in the 130 range, LDL 70s, much improved  Father with CAD, CABG age 70s  EKG personally reviewed by myself on todays visit EKG Interpretation Date/Time:  Tuesday January 26 2024 16:20:13 EDT Ventricular Rate:  83 PR Interval:  158 QRS Duration:  84 QT Interval:  362 QTC Calculation: 425 R Axis:   -7  Text Interpretation: Normal sinus rhythm Minimal voltage criteria for LVH, may be normal variant ( R in aVL ) When compared with ECG of 23-Nov-1998 10:54, No significant change was found Confirmed by Perla Lye [phone]) on 01/26/2024 4:27:03 PM    PMH:   has a past medical history of Diverticulitis, GERD (gastroesophageal reflux disease), Hyperlipidemia, and Hypertension.  PSH:    Past Surgical History:  Procedure Laterality Date    ABDOMINAL HYSTERECTOMY     NASAL SEPTUM SURGERY  2001   throat surgery      Current Outpatient Medications  Medication Sig Dispense Refill   Barberry-Oreg Grape-Goldenseal (BERBERINE COMPLEX PO) Take by mouth in the morning and at bedtime.     celecoxib (CELEBREX) 200 MG capsule Take 200 mg by mouth daily.     ezetimibe (ZETIA) 10 MG tablet Take 10 mg by mouth daily.     fluticasone  (FLONASE ) 50 MCG/ACT nasal spray Place 1 spray into both nostrils daily. 16 g 5   hydrochlorothiazide  (MICROZIDE ) 12.5 MG capsule Take 12.5 mg by mouth daily.     HYDROcodone -acetaminophen  (NORCO/VICODIN) 5-325 MG tablet Take 1 tablet by mouth every 6 (six) hours as needed. 10 tablet 0   loratadine (CLARITIN) 10 MG tablet Take by mouth.     losartan (COZAAR) 50 MG tablet Take 50 mg by mouth daily.     Omega-3 Fatty Acids (FISH OIL OMEGA-3 PO) Take by mouth daily.     omeprazole  (PRILOSEC) 20 MG capsule Take 1 capsule (20 mg total) by mouth daily. 90 capsule 3   rosuvastatin  (CRESTOR ) 10 MG tablet Take 10 mg by mouth daily.     traMADol (ULTRAM) 50 MG tablet Take 50 mg by mouth every 6 (six) hours as needed.     Vitamin D , Ergocalciferol , (DRISDOL) 1.25 MG (50000 UNIT) CAPS capsule Take 50,000 Units by mouth every 7 (seven) days.     metFORMIN (GLUCOPHAGE) 500 MG tablet Take 500 mg by mouth daily. (Patient not taking: Reported on 01/26/2024)     methimazole (  TAPAZOLE) 5 MG tablet Take by mouth. (Patient not taking: Reported on 01/26/2024)     naproxen  (NAPROSYN ) 500 MG tablet Take 1 tablet (500 mg total) by mouth 2 (two) times daily with a meal. (Patient not taking: Reported on 01/26/2024) 30 tablet 0   venlafaxine  XR (EFFEXOR -XR) 75 MG 24 hr capsule Take 75 mg by mouth daily. (Patient not taking: Reported on 01/26/2024)     No current facility-administered medications for this visit.   Allergies:   Metformin, Sulfur, Sulfamethoxazole-trimethoprim, and Sulfur dioxide   Social History:  The patient  reports that  she has quit smoking. She has never used smokeless tobacco. She reports that she does not drink alcohol and does not use drugs.   Family History:   family history includes Alcohol abuse in her maternal grandmother and paternal grandmother; Arthritis in her father; Cancer in her maternal aunt and paternal grandmother; Dementia in her mother; Diabetes in her brother and paternal grandmother; Heart attack in her father and paternal grandmother; Heart disease in her father; Hyperlipidemia in her father; Stroke in her paternal grandmother; Thyroid disease in her mother.   Review of Systems: Review of Systems  Constitutional: Negative.   HENT: Negative.    Respiratory: Negative.    Cardiovascular: Negative.   Gastrointestinal: Negative.   Musculoskeletal: Negative.   Neurological: Negative.   Psychiatric/Behavioral: Negative.    All other systems reviewed and are negative.  PHYSICAL EXAM: VS:  BP (!) 150/78 (BP Location: Left Arm, Patient Position: Sitting, Cuff Size: Normal)   Pulse 83   Ht 5' 4 (1.626 m)   Wt 225 lb 6 oz (102.2 kg)   SpO2 96%   BMI 38.69 kg/m  , BMI Body mass index is 38.69 kg/m. GEN: Well nourished, well developed, in no acute distress HEENT: normal Neck: no JVD, carotid bruits, or masses Cardiac: RRR; no murmurs, rubs, or gallops,no edema  Respiratory:  clear to auscultation bilaterally, normal work of breathing GI: soft, nontender, nondistended, + BS MS: no deformity or atrophy Skin: warm and dry, no rash Neuro:  Strength and sensation are intact Psych: euthymic mood, full affect  Recent Labs: No results found for requested labs within last 365 days.    Lipid Panel Lab Results  Component Value Date   CHOL 131 06/22/2019   HDL 31 (L) 06/22/2019   LDLCALC 85 06/22/2019   TRIG 76 06/22/2019      Wt Readings from Last 3 Encounters:  01/26/24 225 lb 6 oz (102.2 kg)  01/22/22 222 lb 6 oz (100.9 kg)  07/16/20 240 lb (108.9 kg)     ASSESSMENT AND  PLAN:  Problem List Items Addressed This Visit       Cardiology Problems   Hyperlipidemia   Relevant Medications   ezetimibe (ZETIA) 10 MG tablet   hydrochlorothiazide  (MICROZIDE ) 12.5 MG capsule   rosuvastatin  (CRESTOR ) 10 MG tablet   Essential hypertension - Primary   Relevant Medications   ezetimibe (ZETIA) 10 MG tablet   hydrochlorothiazide  (MICROZIDE ) 12.5 MG capsule   rosuvastatin  (CRESTOR ) 10 MG tablet   Other Relevant Orders   EKG 12-Lead (Completed)     Other   Prediabetes   Chest pain, atypical   Relevant Orders   EKG 12-Lead (Completed)   Chest pain/angina Recent episodes of chest pain concerning for ischemia Risk factors including former smoker, diabetes, hyperlipidemia, strong family history of cardiac disease, father with coronary disease and bypass surgery in his 33s - Discussed risks treatment options for ischemic workup, -  We have suggested cardiac CTA for further evaluation - Would pretreat with metoprolol tartrate 100 x 1 - Recent normal CMP performed yesterday - Preapproval with insurance  Essential hypertension Elevated blood pressure today but typically well-controlled at home, numbers reviewed On average running 120 up to 130 systolic, occasionally runs lower, occasionally higher  Hyperlipidemia Tolerating Crestor  10 with Zetia, total cholesterol markedly improved   Signed, Velinda Lunger, M.D., Ph.D. [facility] [place], [place] [phone]

## 2024-01-26 ENCOUNTER — Ambulatory Visit: Attending: Cardiovascular Disease | Admitting: Cardiovascular Disease

## 2024-01-26 ENCOUNTER — Encounter: Payer: Self-pay | Admitting: Cardiovascular Disease

## 2024-01-26 VITALS — BP 150/78 | HR 83 | Ht 64.0 in | Wt 225.4 lb

## 2024-01-26 DIAGNOSIS — I209 Angina pectoris, unspecified: Secondary | ICD-10-CM | POA: Diagnosis not present

## 2024-01-26 DIAGNOSIS — R072 Precordial pain: Secondary | ICD-10-CM | POA: Diagnosis present

## 2024-01-26 DIAGNOSIS — E1159 Type 2 diabetes mellitus with other circulatory complications: Secondary | ICD-10-CM | POA: Diagnosis present

## 2024-01-26 DIAGNOSIS — E782 Mixed hyperlipidemia: Secondary | ICD-10-CM | POA: Insufficient documentation

## 2024-01-26 DIAGNOSIS — R0789 Other chest pain: Secondary | ICD-10-CM

## 2024-01-26 DIAGNOSIS — R7303 Prediabetes: Secondary | ICD-10-CM

## 2024-01-26 DIAGNOSIS — I10 Essential (primary) hypertension: Secondary | ICD-10-CM | POA: Diagnosis present

## 2024-01-26 MED ORDER — METOPROLOL TARTRATE 100 MG PO TABS: 100.0000 mg | ORAL_TABLET | Freq: Once | ORAL | 0 refills | Status: AC

## 2024-01-26 NOTE — Patient Instructions (Addendum)
 Medication Instructions:   No changes  Metoprolol Tartrate 100 MG once two hours prior to Cardiac CT.   If you need a refill on your cardiac medications before your next appointment, please call your pharmacy.   Lab work: No new labs needed  Testing/Procedures:   Your cardiac CT will be scheduled at one of the below locations:    [facility] [address] [place], [place] [address] [phone]   Please follow these instructions carefully (unless otherwise directed):  An IV will be required for this test and Nitroglycerin will be given.  Hold all erectile dysfunction medications at least 3 days (72 hrs) prior to test. (Ie viagra, cialis, sildenafil, tadalafil, etc)   On the Night Before the Test: Be sure to Drink plenty of water. Do not consume any caffeinated/decaffeinated beverages or chocolate 12 hours prior to your test. Do not take any antihistamines 12 hours prior to your test.  On the Day of the Test: Drink plenty of water until 1 hour prior to the test. Do not eat any food 1 hour prior to test. You may take your regular medications prior to the test.  Take metoprolol (Lopressor) 100 MG two hours prior to test. If you take Furosemide/Hydrochlorothiazide /Spironolactone/Chlorthalidone, please HOLD on the morning of the test. Patients who wear a continuous glucose monitor MUST remove the device prior to scanning. FEMALES- please wear underwire-free bra if available, avoid dresses & tight clothing      After the Test: Drink plenty of water. After receiving IV contrast, you may experience a mild flushed feeling. This is normal. On occasion, you may experience a mild rash up to 24 hours after the test. This is not dangerous. If this occurs, you can take Benadryl 25 mg, Zyrtec, Claritin, or Allegra and increase your fluid intake. (Patients taking Tikosyn should avoid Benadryl, and may take Zyrtec, Claritin, or Allegra) If you experience trouble  breathing, this can be serious. If it is severe call [phone] IMMEDIATELY. If it is mild, please call our office.  We will call to schedule your test 2-4 weeks out understanding that some insurance companies will need an authorization prior to the service being performed.   For more information and frequently asked questions, please visit our website : [email or web address]  For non-scheduling related questions, please contact the cardiac imaging nurse navigator should you have any questions/concerns: Cardiac Imaging Nurse Navigators Direct Office Dial: [phone]   For scheduling needs, including cancellations and rescheduling, please call [place], [phone].   Follow-Up: At [facility], you and your health needs are our priority.  As part of our continuing mission to provide you with exceptional heart care, we have created designated Provider Care Teams.  These Care Teams include your primary Cardiologist (physician) and Advanced Practice Providers (APPs -  Physician Assistants and Nurse Practitioners) who all work together to provide you with the care you need, when you need it.  You will need a follow up appointment in 12 months  Providers on your designated Care Team:   Lonni Meager, NP Bernardino Bring, PA-C Cadence [place], [place]  COVID-19 Vaccine Information can be found at: [email or web address] For questions related to vaccine distribution or appointments, please email [email or web address] or call [phone].

## 2024-02-16 ENCOUNTER — Encounter (HOSPITAL_COMMUNITY): Payer: Self-pay

## 2024-02-18 ENCOUNTER — Ambulatory Visit
Admission: RE | Admit: 2024-02-18 | Discharge: 2024-02-18 | Disposition: A | Discharge status: Home / Self Care | Source: Ambulatory Visit | Attending: Cardiovascular Disease | Admitting: Cardiovascular Disease

## 2024-02-18 DIAGNOSIS — R072 Precordial pain: Secondary | ICD-10-CM | POA: Diagnosis present

## 2024-02-18 MED ORDER — DILTIAZEM HCL 25 MG/5ML IV SOLN: 10.0000 mg | INTRAVENOUS | Status: DC | PRN

## 2024-02-18 MED ORDER — METOPROLOL TARTRATE 5 MG/5ML IV SOLN: 10.0000 mg | Freq: Once | INTRAVENOUS | Status: DC | PRN

## 2024-02-18 MED ORDER — NITROGLYCERIN 0.4 MG SL SUBL
0.8000 mg | SUBLINGUAL_TABLET | Freq: Once | SUBLINGUAL | Status: AC
  Administered 2024-02-18: 0.8 mg via SUBLINGUAL
  Filled 2024-02-18: qty 25

## 2024-02-18 MED ORDER — IOHEXOL 350 MG/ML SOLN
100.0000 mL | Freq: Once | INTRAVENOUS | Status: AC | PRN
  Administered 2024-02-18: 100 mL via INTRAVENOUS

## 2024-02-18 NOTE — Progress Notes (Signed)
 Patient tolerated CT well. Vital signs stable encourage to drink water throughout day.Reasons explained and verbalized understanding. Ambulated steady gait.

## 2024-02-27 ENCOUNTER — Ambulatory Visit: Payer: Self-pay | Admitting: Cardiovascular Disease
